# Patient Record
Sex: Male | Born: 1935 | Race: White | Hispanic: No | Marital: Married | Smoking: Former smoker
Health system: Southern US, Community
[De-identification: ages and names within clinical notes are randomized; demographics above are authoritative.]

## PROBLEM LIST (undated history)

## (undated) DIAGNOSIS — M1990 Unspecified osteoarthritis, unspecified site: Secondary | ICD-10-CM

## (undated) DIAGNOSIS — R600 Localized edema: Secondary | ICD-10-CM

## (undated) DIAGNOSIS — Z87440 Personal history of urinary (tract) infections: Secondary | ICD-10-CM

## (undated) DIAGNOSIS — R002 Palpitations: Secondary | ICD-10-CM

## (undated) DIAGNOSIS — I2510 Atherosclerotic heart disease of native coronary artery without angina pectoris: Secondary | ICD-10-CM

## (undated) DIAGNOSIS — E785 Hyperlipidemia, unspecified: Secondary | ICD-10-CM

## (undated) DIAGNOSIS — L409 Psoriasis, unspecified: Secondary | ICD-10-CM

## (undated) HISTORY — PX: TONSILLECTOMY: SUR1361

---
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
## 2001-12-15 ENCOUNTER — Ambulatory Visit (HOSPITAL_COMMUNITY): Admission: RE | Admit: 2001-12-15 | Discharge: 2001-12-15 | Payer: Self-pay | Admitting: *Deleted

## 2011-02-05 ENCOUNTER — Ambulatory Visit (HOSPITAL_COMMUNITY)
Admission: RE | Admit: 2011-02-05 | Discharge: 2011-02-05 | Disposition: A | Discharge status: Home / Self Care | Payer: Medicare Other | Source: Ambulatory Visit | Attending: Cardiology | Admitting: Cardiology

## 2011-02-05 DIAGNOSIS — R0989 Other specified symptoms and signs involving the circulatory and respiratory systems: Secondary | ICD-10-CM | POA: Insufficient documentation

## 2011-02-05 DIAGNOSIS — R0609 Other forms of dyspnea: Secondary | ICD-10-CM | POA: Insufficient documentation

## 2011-02-05 DIAGNOSIS — I2510 Atherosclerotic heart disease of native coronary artery without angina pectoris: Secondary | ICD-10-CM | POA: Insufficient documentation

## 2011-02-05 DIAGNOSIS — R0602 Shortness of breath: Secondary | ICD-10-CM | POA: Insufficient documentation

## 2011-02-05 HISTORY — PX: CARDIAC CATHETERIZATION: SHX172

## 2011-02-05 NOTE — Cardiovascular Report (Signed)
[name], [name] NO.:  [id number]  MEDICAL RECORD NO.:  [id number]  LOCATION:  [facility]                         FACILITY:  [facility]  PHYSICIAN:  Pamella Pert, MD DATE OF BIRTH:  1927/06/14  DATE OF PROCEDURE:  02/05/2011 DATE OF DISCHARGE:                           CARDIAC CATHETERIZATION   PROCEDURES PERFORMED: 1. Left ventriculography. 2. Selective right and left coronary arteriography.  INDICATIONS:  [name] is a very pleasant, fairly active 77-year-old gentleman with history of mild noncritical coronary artery disease by cardiac catheterization that was done in 2007.  He had been complaining of shortness of breath, dyspnea on exertion, and underwent a treadmill exercise stress test where he was markedly positive for ischemia at low level of stress test at 75 minute and EKG changes persisted for greater than 2 minutes into recovery.  Given his marked shortness of breath, markedly reduced exercise tolerance, and early positive EKG changes, progression of coronary disease was suspected. Hence, he was brought to the cardiac catheterization lab to evaluate his coronary anatomy.  HEMODYNAMIC DATA:  The left ventricular pressure was 102/9 with end- diastolic pressure of 14 mmHg.  Aortic pressure was 170/56 with a mean of 77 mmHg.  There was no pressure gradient across the aortic valve.  ANGIOGRAPHIC DATA:  Left ventricle.  Left ventricular systolic function was normal with ejection fraction of 55-60%.  There was no significant mitral regurgitation.  Right coronary artery.  Right coronary artery is a large-caliber vessel and a dominant vessel giving origin to two PDA branches and one large PL branch.  Mid segment of the right coronary artery shows a 20-30% stenosis.  Otherwise, it is smooth, large-caliber vessel, and normal and dominant.  Left main.  Left main coronary artery is a large-caliber vessel.  It is smooth and normal with mild  calcification.  LAD.  The proximal LAD shows mild eccentric calcification.  There is 20- 30% stenosis of the proximal LAD.  This is a very large-caliber vessel. Gives origin to small diagonal 1, moderate-sized diagonal 2.  Otherwise, the rest of the LAD is smooth and normal.  Circumflex artery.  Circumflex artery is a large-caliber vessel giving origin to a proximal obtuse marginal 1 and a large obtuse marginal 2 and 3.  It is smooth and normal.  IMPRESSION: 1. Noncritical coronary artery disease.  A 20-30% proximal left     anterior descending calcific stenosis and a mid right coronary     artery 20-30% stenosis.  Otherwise, smooth and normal vessels,     large-calibered vessels. 2. Normal left ventricular systolic function. 3. Normal left ventricular end-diastolic pressure.  RECOMMENDATIONS:  I suspect the EKG changes were probably falsely positive.  Continued weight loss and aggressive risk modification is indicated.  The patient will be discharged home today with outpatient followup.  TECHNIQUE OF THE PROCEDURE:  Under sterile precautions, using a 6-French right radial access, a 6-French TIG 4 catheter was advanced into the ascending aorta over a Glidewire.  The catheter was then utilized to engage left main coronary artery and also the right coronary artery and angiography was performed.  The catheter was then exchanged out of the body over  a safety J-wire which was Exchange Length.  A 5-French pigtail catheter was advanced back to the ascending aorta and then into the left ventricle.  Left ventriculography was performed both in the LAO and RAO projection.  Catheter was then pulled out of the body over a J-wire.  A total of 60 mL of contrast was utilized for diagnostic angiography. Hemostasis was obtained by applying TR band.  The patient tolerated the procedure well.  There were no immediate complications.     Pamella Pert, MD     JRG/MEDQ  D:  02/05/2011  T:   02/05/2011  Job:  [id number]  cc:   Massie Maroon, MD  Electronically Signed by Yates Decamp MD on 02/05/2011 11:29:46 AM

## 2013-09-10 NOTE — Progress Notes (Signed)
Surgery on 10/04/13.  Need orders in EPIC.  Thank You.  

## 2013-09-17 NOTE — Progress Notes (Signed)
Need orders please - pt coming for preop 4/30/154 - thank you

## 2013-09-20 ENCOUNTER — Encounter (HOSPITAL_COMMUNITY): Payer: Self-pay | Admitting: Pharmacy Technician

## 2013-09-23 ENCOUNTER — Ambulatory Visit (HOSPITAL_COMMUNITY)
Admission: RE | Admit: 2013-09-23 | Discharge: 2013-09-23 | Disposition: A | Discharge status: Home / Self Care | Payer: Medicare Other | Source: Ambulatory Visit | Attending: Anesthesiology | Admitting: Anesthesiology

## 2013-09-23 ENCOUNTER — Encounter (HOSPITAL_COMMUNITY)
Admission: RE | Admit: 2013-09-23 | Discharge: 2013-09-23 | Disposition: A | Discharge status: Home / Self Care | Payer: Medicare Other | Source: Ambulatory Visit | Attending: Orthopedic Surgery | Admitting: Orthopedic Surgery

## 2013-09-23 ENCOUNTER — Encounter (HOSPITAL_COMMUNITY): Payer: Self-pay

## 2013-09-23 DIAGNOSIS — Z01810 Encounter for preprocedural cardiovascular examination: Secondary | ICD-10-CM | POA: Insufficient documentation

## 2013-09-23 DIAGNOSIS — Z01812 Encounter for preprocedural laboratory examination: Secondary | ICD-10-CM | POA: Insufficient documentation

## 2013-09-23 DIAGNOSIS — Z01818 Encounter for other preprocedural examination: Secondary | ICD-10-CM | POA: Insufficient documentation

## 2013-09-23 HISTORY — DX: Unspecified osteoarthritis, unspecified site: M19.90

## 2013-09-23 HISTORY — DX: Psoriasis, unspecified: L40.9

## 2013-09-23 HISTORY — DX: Hyperlipidemia, unspecified: E78.5

## 2013-09-23 HISTORY — DX: Atherosclerotic heart disease of native coronary artery without angina pectoris: I25.10

## 2013-09-23 LAB — COMPREHENSIVE METABOLIC PANEL
ALBUMIN: 3.9 g/dL (ref 3.5–5.2)
ALT: 28 U/L (ref 0–53)
AST: 26 U/L (ref 0–37)
Alkaline Phosphatase: 69 U/L (ref 39–117)
BILIRUBIN TOTAL: 0.4 mg/dL (ref 0.3–1.2)
BUN: 20 mg/dL (ref 6–23)
CO2: 24 meq/L (ref 19–32)
CREATININE: 0.86 mg/dL (ref 0.50–1.35)
Calcium: 9.2 mg/dL (ref 8.4–10.5)
Chloride: 101 mEq/L (ref 96–112)
GFR calc Af Amer: >90 mL/min (ref >90)
GFR, EST NON AFRICAN AMERICAN: 78 mL/min — AB (ref >90)
Glucose, Bld: 87 mg/dL (ref 70–99)
Potassium: 5 mEq/L (ref 3.7–5.3)
Sodium: 138 mEq/L (ref 137–147)
Total Protein: 7.5 g/dL (ref 6.0–8.3)

## 2013-09-23 LAB — URINALYSIS, ROUTINE W REFLEX MICROSCOPIC
Bilirubin Urine: NEGATIVE
Glucose, UA: NEGATIVE mg/dL
Hgb urine dipstick: NEGATIVE
KETONES UR: NEGATIVE mg/dL
Leukocytes, UA: NEGATIVE
NITRITE: NEGATIVE
PROTEIN: NEGATIVE mg/dL
Specific Gravity, Urine: 1.024 (ref 1.005–1.030)
UROBILINOGEN UA: 1 mg/dL (ref 0.0–1.0)
pH: 6 (ref 5.0–8.0)

## 2013-09-23 LAB — CBC
HCT: 41.6 % (ref 39.0–52.0)
Hemoglobin: 14.2 g/dL (ref 13.0–17.0)
MCH: 31.2 pg (ref 26.0–34.0)
MCHC: 34.1 g/dL (ref 30.0–36.0)
MCV: 91.4 fL (ref 78.0–100.0)
PLATELETS: 185 10*3/uL (ref 150–400)
RBC: 4.55 MIL/uL (ref 4.22–5.81)
RDW: 13.9 % (ref 11.5–15.5)
WBC: 9.2 10*3/uL (ref 4.0–10.5)

## 2013-09-23 LAB — PROTIME-INR
INR: 1.05 (ref 0.00–1.49)
PROTHROMBIN TIME: 13.5 s (ref 11.6–15.2)

## 2013-09-23 LAB — APTT: APTT: 32 s (ref 24–37)

## 2013-09-23 LAB — SURGICAL PCR SCREEN
MRSA, PCR: NEGATIVE
Staphylococcus aureus: NEGATIVE

## 2013-09-23 NOTE — Progress Notes (Signed)
09/23/13 1400  OBSTRUCTIVE SLEEP APNEA  Have you ever been diagnosed with sleep apnea through a sleep study? No  Do you snore loudly (loud enough to be heard through closed doors)?  0  Do you often feel tired, fatigued, or sleepy during the daytime? 0  Has anyone observed you stop breathing during your sleep? 1  Do you have, or are you being treated for high blood pressure? 0  BMI more than 35 kg/m2? 0  Age over 78 years old? 1  Neck circumference greater than 40 cm/16 inches? 1  Gender: 1  Obstructive Sleep Apnea Score 4  Score 4 or greater  Results sent to PCP

## 2013-09-23 NOTE — Patient Instructions (Signed)
[facility]  ON:  Monday MAY 11  REPORT TO  SHORT STAY CENTER AT:  10:45 AM      PHONE # FOR SHORT STAY IS [phone]  DO NOT EAT  ANYTHING AFTER MIDNIGHT THE NIGHT BEFORE YOUR SURGERY.  YOU MAY BRUSH YOUR TEETH  - NO FOOD, NO CHEWING GUM, NO MINTS, NO CANDIES, NO CHEWING TOBACCO. YOU MAY HAVE CLEAR LIQUIDS TO DRINK FROM MIDNIGHT UNTIL 7:45 AM DAY OF YOUR SURGERY - LIKE WATER, COFFEE ( NO MILK OR MILK PRODUCTS ).\, CRANBERRY JUICE.  NOTHING TO DRINK AFTER 7:45 AM DAY OF SURGERY.  PLEASE TAKE THE FOLLOWING MEDICATIONS THE AM OF YOUR SURGERY WITH A FEW SIPS OF WATER:  NO MEDS TO TAKE  IF YOU USE INHALERS--USE YOUR INHALERS THE AM OF YOUR SURGERY AND BRING INHALERS TO THE HOSPITAL.   IF YOU ARE DIABETIC:  DO NOT TAKE ANY DIABETIC MEDICATIONS THE AM OF YOUR SURGERY.  IF YOU TAKE INSULIN IN THE EVENINGS--PLEASE ONLY TAKE 1/2 NORMAL EVENING DOSE THE NIGHT BEFORE YOUR SURGERY.  NO INSULIN THE AM OF YOUR SURGERY. IF YOU HAVE SLEEP APNEA AND USE CPAP OR BIPAP--PLEASE BRING THE MASK AND THE TUBING.  DO NOT BRING YOUR MACHINE.  DO NOT BRING VALUABLES, MONEY, CREDIT CARDS.  DO NOT WEAR JEWELRY, MAKE-UP, NAIL POLISH AND NO METAL PINS OR CLIPS IN YOUR HAIR. CONTACT LENS, DENTURES / PARTIALS, GLASSES SHOULD NOT BE WORN TO SURGERY AND IN MOST CASES-HEARING AIDS WILL NEED TO BE REMOVED.  BRING YOUR GLASSES CASE, ANY EQUIPMENT NEEDED FOR YOUR CONTACT LENS. FOR PATIENTS ADMITTED TO THE HOSPITAL--CHECK OUT TIME THE DAY OF DISCHARGE IS 11:00 AM.  ALL INPATIENT ROOMS ARE PRIVATE - WITH BATHROOM, TELEPHONE, TELEVISION AND WIFI INTERNET.                                                    PLEASE READ OVER ANY  FACT SHEETS THAT YOU WERE GIVEN: MRSA INFORMATION, BLOOD TRANSFUSION INFORMATION, INCENTIVE SPIROMETER INFORMATION.  PLEASE BE AWARE THAT YOU MAY NEED ADDITIONAL BLOOD DRAWN DAY OF YOUR  SURGERY  _______________________________________________________________________   [facility] - Preparing for [facility] Before surgery, you can play an important role.  Because skin is not sterile, your skin needs to be as free of germs as possible.  You can reduce the number of germs on your skin by washing with CHG (chlorahexidine gluconate) soap before surgery.  CHG is an antiseptic cleaner which kills germs and bonds with the skin to continue killing germs even after washing. Please DO NOT use if you have an allergy to CHG or antibacterial soaps.  If your skin becomes reddened/irritated stop using the CHG and inform your nurse when you arrive at Short Stay. Do not shave (including legs and underarms) for at least 48 hours prior to the first CHG shower.  You may shave your face. Please follow these instructions carefully:  1.  Shower with CHG Soap the night before surgery and the  morning of Surgery.  2.  If you choose to wash your hair, wash your hair first as usual with your  normal  shampoo.  3.  After you shampoo, rinse your hair and body thoroughly to remove the  shampoo.  4.  Use CHG as you would any other liquid soap.  You can apply chg directly  to the skin and wash                       Gently with a scrungie or clean washcloth.  5.  Apply the CHG Soap to your body ONLY FROM THE NECK DOWN.   Do not use on open                           Wound or open sores. Avoid contact with eyes, ears mouth and genitals (private parts).                        Genitals (private parts) with your normal soap.             6.  Wash thoroughly, paying special attention to the area where your surgery  will be performed.  7.  Thoroughly rinse your body with warm water from the neck down.  8.  DO NOT shower/wash with your normal soap after using and rinsing off  the CHG Soap.                9.  Pat yourself dry with a clean towel.            10.  Wear clean pajamas.            11.   Place clean sheets on your bed the night of your first shower and do not  sleep with pets. Day of Surgery : Do not apply any lotions/deodorants the morning of surgery.  Please wear clean clothes to the hospital/surgery center.  FAILURE TO FOLLOW THESE INSTRUCTIONS MAY RESULT IN THE CANCELLATION OF YOUR SURGERY PATIENT SIGNATURE_________________________________  NURSE SIGNATURE__________________________________  ________________________________________________________________________   [name]  An incentive spirometer is a tool that can help keep your lungs clear and active. This tool measures how well you are filling your lungs with each breath. Taking long deep breaths may help reverse or decrease the chance of developing breathing (pulmonary) problems (especially infection) following:  A long period of time when you are unable to move or be active. BEFORE THE PROCEDURE   If the spirometer includes an indicator to show your best effort, your nurse or respiratory therapist will set it to a desired goal.  If possible, sit up straight or lean slightly forward. Try not to slouch.  Hold the incentive spirometer in an upright position. INSTRUCTIONS FOR USE  1. Sit on the edge of your bed if possible, or sit up as far as you can in bed or on a chair. 2. Hold the incentive spirometer in an upright position. 3. Breathe out normally. 4. Place the mouthpiece in your mouth and seal your lips tightly around it. 5. Breathe in slowly and as deeply as possible, raising the piston or the ball toward the top of the column. 6. Hold your breath for 3-5 seconds or for as long as possible. Allow the piston or ball to fall to the bottom of the column. 7. Remove the mouthpiece from your mouth and breathe out normally. 8. Rest for a few seconds and repeat Steps 1 through 7 at least 10 times every 1-2 hours when you are awake. Take your time and take a few normal breaths between deep  breaths. 9. The spirometer may include an indicator to show your best  effort. Use the indicator as a goal to work toward during each repetition. 10. After each set of 10 deep breaths, practice coughing to be sure your lungs are clear. If you have an incision (the cut made at the time of surgery), support your incision when coughing by placing a pillow or rolled up towels firmly against it. Once you are able to get out of bed, walk around indoors and cough well. You may stop using the incentive spirometer when instructed by your caregiver.  RISKS AND COMPLICATIONS  Take your time so you do not get dizzy or light-headed.  If you are in pain, you may need to take or ask for pain medication before doing incentive spirometry. It is harder to take a deep breath if you are having pain. AFTER USE  Rest and breathe slowly and easily.  It can be helpful to keep track of a log of your progress. Your caregiver can provide you with a simple table to help with this. If you are using the spirometer at home, follow these instructions: SEEK MEDICAL CARE IF:   You are having difficultly using the spirometer.  You have trouble using the spirometer as often as instructed.  Your pain medication is not giving enough relief while using the spirometer.  You develop fever of 100.5 F (38.1 C) or higher. SEEK IMMEDIATE MEDICAL CARE IF:   You cough up bloody sputum that had not been present before.  You develop fever of 102 F (38.9 C) or greater.  You develop worsening pain at or near the incision site. MAKE SURE YOU:   Understand these instructions.  Will watch your condition.  Will get help right away if you are not doing well or get worse. Document Released: 09/23/2006 Document Revised: 08/05/2011 Document Reviewed: 11/24/2006 Exit[employer] Patient Information 2014 [employer], [place].   ________________________________________________________________________  WHAT IS A BLOOD TRANSFUSION? Blood  Transfusion Information  A transfusion is the replacement of blood or some of its parts. Blood is made up of multiple cells which provide different functions.  Red blood cells carry oxygen and are used for blood loss replacement.  White blood cells fight against infection.  Platelets control bleeding.  Plasma helps clot blood.  Other blood products are available for specialized needs, such as hemophilia or other clotting disorders. BEFORE THE TRANSFUSION  Who gives blood for transfusions?   Healthy volunteers who are fully evaluated to make sure their blood is safe. This is blood bank blood. Transfusion therapy is the safest it has ever been in the practice of medicine. Before blood is taken from a donor, a complete history is taken to make sure that person has no history of diseases nor engages in risky social behavior (examples are intravenous drug use or sexual activity with multiple partners). The donor's travel history is screened to minimize risk of transmitting infections, such as malaria. The donated blood is tested for signs of infectious diseases, such as HIV and hepatitis. The blood is then tested to be sure it is compatible with you in order to minimize the chance of a transfusion reaction. If you or a relative donates blood, this is often done in anticipation of surgery and is not appropriate for emergency situations. It takes many days to process the donated blood. RISKS AND COMPLICATIONS Although transfusion therapy is very safe and saves many lives, the main dangers of transfusion include:   Getting an infectious disease.  Developing a transfusion reaction. This is an allergic reaction to something in the  blood you were given. Every precaution is taken to prevent this. The decision to have a blood transfusion has been considered carefully by your caregiver before blood is given. Blood is not given unless the benefits outweigh the risks. AFTER THE TRANSFUSION  Right after  receiving a blood transfusion, you will usually feel much better and more energetic. This is especially true if your red blood cells have gotten low (anemic). The transfusion raises the level of the red blood cells which carry oxygen, and this usually causes an energy increase.  The nurse administering the transfusion will monitor you carefully for complications. HOME CARE INSTRUCTIONS  No special instructions are needed after a transfusion. You may find your energy is better. Speak with your caregiver about any limitations on activity for underlying diseases you may have. SEEK MEDICAL CARE IF:   Your condition is not improving after your transfusion.  You develop redness or irritation at the intravenous (IV) site. SEEK IMMEDIATE MEDICAL CARE IF:  Any of the following symptoms occur over the next 12 hours:  Shaking chills.  You have a temperature by mouth above 102 F (38.9 C), not controlled by medicine.  Chest, back, or muscle pain.  People around you feel you are not acting correctly or are confused.  Shortness of breath or difficulty breathing.  Dizziness and fainting.  You get a rash or develop hives.  You have a decrease in urine output.  Your urine turns a dark color or changes to pink, red, or brown. Any of the following symptoms occur over the next 10 days:  You have a temperature by mouth above 102 F (38.9 C), not controlled by medicine.  Shortness of breath.  Weakness after normal activity.  The white part of the eye turns yellow (jaundice).  You have a decrease in the amount of urine or are urinating less often.  Your urine turns a dark color or changes to pink, red, or brown. Document Released: 05/10/2000 Document Revised: 08/05/2011 Document Reviewed: 12/28/2007 [facility] Patient Information 2014 [place], [place].  _______________________________________________________________________

## 2013-09-23 NOTE — Pre-Procedure Instructions (Signed)
[facility] D[facility] CARDIOLOGY OFFICE NOTE 02/27/12 FROM DR. [employer] AND CARDIAC CATH REPORT 02/05/11 [facility] ON PT'S CHART.

## 2013-09-25 ENCOUNTER — Other Ambulatory Visit: Payer: Self-pay | Admitting: Orthopedic Surgery

## 2013-09-30 NOTE — H&P (Signed)
TOTAL KNEE ADMISSION H&P  Patient is being admitted for left total knee arthroplasty.  Subjective:  Chief Complaint:left knee pain.  HPI: [name], 80 y.o. male, has a history of pain and functional disability in the left knee due to arthritis and has failed non-surgical conservative treatments for greater than 12 weeks to includeNSAID's and/or analgesics, corticosteriod injections and activity modification.  Onset of symptoms was gradual, starting 5 years ago with gradually worsening course since that time. The patient noted no past surgery on the left knee(s).  Patient currently rates pain in the left knee(s) at 6 out of 10 with activity. Patient has night pain, worsening of pain with activity and weight bearing, pain that interferes with activities of daily living, pain with passive range of motion, crepitus and joint swelling.  Patient has evidence of periarticular osteophytes and joint space narrowing by imaging studies.  There is no active infection.   Past Medical History  Diagnosis Date  . Hypertension   . Shortness of breath     WITH EXERTION  . Arthritis     STATES HIS SHOULDERS ARE "FROZEN" - VERY LIMITIED RANGE OF MOTION; OA BOTH KNEES  . Hyperlipidemia   . Psoriasis   . Coronary artery disease     "MILD NONCRITICAL CORONARY ARTERY DISEASE " - PER OFFICE NOTE DR. Jacinto HalimGANJI 02/27/12 - PT NO LONGER HAS TO SEE CARDIOLGIST UNLESS HE HAS A NEED.    Past Surgical History  Procedure Laterality Date  . Cardiac catheterization  02-05-11  . Tonsillectomy      AGE 65    No prescriptions prior to admission   No Known Allergies  History  Substance Use Topics  . Smoking status: Former Smoker -- 20 years    Types: Cigarettes, Pipe, Cigars  . Smokeless tobacco: Never Used  . Alcohol Use: Yes     Comment: QUIT SMOKING ABOUT 25 YRS AGO  RARE ALCOHOL      Review of Systems  Constitutional: Negative.   HENT: Positive for hearing loss and tinnitus. Negative for congestion, ear  discharge, ear pain, nosebleeds and sore throat.   Eyes: Negative.   Respiratory: Negative.  Negative for stridor.   Cardiovascular: Negative.   Gastrointestinal: Negative.   Genitourinary: Positive for frequency. Negative for dysuria, urgency, hematuria and flank pain.  Musculoskeletal: Positive for joint pain. Negative for back pain, falls, myalgias and neck pain.       Left knee pain  Skin: Positive for rash. Negative for itching.       Psoriasis   Neurological: Negative.   Psychiatric/Behavioral: Negative.     Objective:  Physical Exam  Constitutional: He is oriented to person, place, and time. He appears well-developed and well-nourished. No distress.  HENT:  Head: Normocephalic and atraumatic.  Right Ear: External ear normal.  Left Ear: External ear normal.  Nose: Nose normal.  Mouth/Throat: Oropharynx is clear and moist.  Eyes: Conjunctivae and EOM are normal.  Neck: Normal range of motion. Neck supple.  Cardiovascular: Normal rate, regular rhythm, normal heart sounds and intact distal pulses.   No murmur heard. Respiratory: Effort normal and breath sounds normal. No respiratory distress. He has no wheezes.  GI: Soft. Bowel sounds are normal. He exhibits no distension. There is no tenderness.  Musculoskeletal:       Right hip: Normal.       Left hip: Normal.       Right knee: He exhibits decreased range of motion and swelling. He exhibits no effusion  and no erythema. Tenderness found. Medial joint line tenderness noted. No lateral joint line tenderness noted.       Left knee: He exhibits decreased range of motion and swelling. He exhibits no effusion and no erythema. Tenderness found. Medial joint line and lateral joint line tenderness noted.       Right lower leg: He exhibits no tenderness and no swelling.       Left lower leg: He exhibits no tenderness and no swelling.  Evaluation of his left knee shows no effusion. His range of motion is about 5-125 degrees. There is  moderate crepitus on range of motion. He is tender medial greater than lateral with no instability noted.  Neurological: He is alert and oriented to person, place, and time. He has normal strength and normal reflexes. No sensory deficit.  Skin: He is not diaphoretic. No erythema.  Psychiatric: He has a normal mood and affect. His behavior is normal.   Vitals Weight: 245 lb Height: 71 in Body Surface Area: 2.36 m Body Mass Index: 34.17 kg/m Pulse: 76 (Regular) BP: 130/78 (Sitting, Left Arm, Standard)   Imaging Review Plain radiographs demonstrate severe degenerative joint disease of the left knee(s). The overall alignment ismild varus. The bone quality appears to be good for age and reported activity level.  Assessment/Plan:  End stage arthritis, left knee   The patient history, physical examination, clinical judgment of the provider and imaging studies are consistent with end stage degenerative joint disease of the left knee(s) and total knee arthroplasty is deemed medically necessary. The treatment options including medical management, injection therapy arthroscopy and arthroplasty were discussed at length. The risks and benefits of total knee arthroplasty were presented and reviewed. The risks due to aseptic loosening, infection, stiffness, patella tracking problems, thromboembolic complications and other imponderables were discussed. The patient acknowledged the explanation, agreed to proceed with the plan and consent was signed. Patient is being admitted for inpatient treatment for surgery, pain control, PT, OT, prophylactic antibiotics, VTE prophylaxis, progressive ambulation and ADL's and discharge planning. The patient is planning to be discharged to skilled nursing facility  No TXA due to CAD    Dimitri PedAmber Abree Romick, PA-C

## 2013-10-04 ENCOUNTER — Inpatient Hospital Stay (HOSPITAL_COMMUNITY): Payer: Medicare Other | Admitting: Certified Registered Nurse Anesthetist

## 2013-10-04 ENCOUNTER — Encounter (HOSPITAL_COMMUNITY)
Admission: RE | Disposition: A | Discharge status: Skilled Nursing Facility | Payer: Self-pay | Source: Ambulatory Visit | Attending: Orthopedic Surgery

## 2013-10-04 ENCOUNTER — Encounter (HOSPITAL_COMMUNITY): Payer: Medicare Other | Admitting: Certified Registered Nurse Anesthetist

## 2013-10-04 ENCOUNTER — Inpatient Hospital Stay (HOSPITAL_COMMUNITY)
Admission: RE | Admit: 2013-10-04 | Discharge: 2013-10-07 | DRG: 470 | Disposition: A | Discharge status: Skilled Nursing Facility | Payer: Medicare Other | Source: Ambulatory Visit | Attending: Orthopedic Surgery | Admitting: Orthopedic Surgery

## 2013-10-04 ENCOUNTER — Encounter (HOSPITAL_COMMUNITY): Payer: Self-pay | Admitting: *Deleted

## 2013-10-04 DIAGNOSIS — M179 Osteoarthritis of knee, unspecified: Secondary | ICD-10-CM | POA: Diagnosis present

## 2013-10-04 DIAGNOSIS — D62 Acute posthemorrhagic anemia: Secondary | ICD-10-CM | POA: Diagnosis not present

## 2013-10-04 DIAGNOSIS — Z87891 Personal history of nicotine dependence: Secondary | ICD-10-CM

## 2013-10-04 DIAGNOSIS — E785 Hyperlipidemia, unspecified: Secondary | ICD-10-CM | POA: Diagnosis present

## 2013-10-04 DIAGNOSIS — I10 Essential (primary) hypertension: Secondary | ICD-10-CM | POA: Diagnosis present

## 2013-10-04 DIAGNOSIS — M1710 Unilateral primary osteoarthritis, unspecified knee: Secondary | ICD-10-CM | POA: Diagnosis present

## 2013-10-04 DIAGNOSIS — E871 Hypo-osmolality and hyponatremia: Secondary | ICD-10-CM | POA: Diagnosis not present

## 2013-10-04 DIAGNOSIS — L408 Other psoriasis: Secondary | ICD-10-CM | POA: Diagnosis present

## 2013-10-04 DIAGNOSIS — Z96652 Presence of left artificial knee joint: Secondary | ICD-10-CM

## 2013-10-04 DIAGNOSIS — I2510 Atherosclerotic heart disease of native coronary artery without angina pectoris: Secondary | ICD-10-CM | POA: Diagnosis present

## 2013-10-04 DIAGNOSIS — M898X9 Other specified disorders of bone, unspecified site: Secondary | ICD-10-CM | POA: Diagnosis present

## 2013-10-04 HISTORY — PX: TOTAL KNEE ARTHROPLASTY: SHX125

## 2013-10-04 LAB — ABO/RH: ABO/RH(D): O POS

## 2013-10-04 LAB — TYPE AND SCREEN
ABO/RH(D): O POS
Antibody Screen: NEGATIVE

## 2013-10-04 SURGERY — ARTHROPLASTY, KNEE, TOTAL
Anesthesia: Spinal | Site: Knee | Laterality: Left

## 2013-10-04 MED ORDER — DEXTROSE 5 % IV SOLN
500.0000 mg | Freq: Four times a day (QID) | INTRAVENOUS | Status: DC | PRN
  Administered 2013-10-04: 500 mg via INTRAVENOUS
  Filled 2013-10-04: qty 5

## 2013-10-04 MED ORDER — PROMETHAZINE HCL 25 MG/ML IJ SOLN: 6.2500 mg | INTRAMUSCULAR | Status: DC | PRN

## 2013-10-04 MED ORDER — PHENOL 1.4 % MT LIQD: 1.0000 | OROMUCOSAL | Status: DC | PRN

## 2013-10-04 MED ORDER — SODIUM CHLORIDE 0.9 % IJ SOLN
INTRAMUSCULAR | Status: AC
  Filled 2013-10-04: qty 50

## 2013-10-04 MED ORDER — STERILE WATER FOR IRRIGATION IR SOLN
Status: DC | PRN
  Administered 2013-10-04: 1500 mL

## 2013-10-04 MED ORDER — DEXAMETHASONE SODIUM PHOSPHATE 10 MG/ML IJ SOLN
INTRAMUSCULAR | Status: DC | PRN
  Administered 2013-10-04: 10 mg via INTRAVENOUS

## 2013-10-04 MED ORDER — ONDANSETRON HCL 4 MG/2ML IJ SOLN: 4.0000 mg | Freq: Four times a day (QID) | INTRAMUSCULAR | Status: DC | PRN

## 2013-10-04 MED ORDER — BUPIVACAINE HCL (PF) 0.25 % IJ SOLN
INTRAMUSCULAR | Status: AC
  Filled 2013-10-04: qty 30

## 2013-10-04 MED ORDER — SODIUM CHLORIDE 0.9 % IJ SOLN
INTRAMUSCULAR | Status: DC | PRN
  Administered 2013-10-04: 30 mL

## 2013-10-04 MED ORDER — CEFAZOLIN SODIUM-DEXTROSE 2-3 GM-% IV SOLR
2.0000 g | INTRAVENOUS | Status: AC
  Administered 2013-10-04: 2 g via INTRAVENOUS

## 2013-10-04 MED ORDER — ACETAMINOPHEN 325 MG PO TABS
650.0000 mg | ORAL_TABLET | Freq: Four times a day (QID) | ORAL | Status: DC | PRN
  Administered 2013-10-05 – 2013-10-06 (×2): 650 mg via ORAL
  Filled 2013-10-04 (×2): qty 2

## 2013-10-04 MED ORDER — DEXAMETHASONE SODIUM PHOSPHATE 10 MG/ML IJ SOLN
INTRAMUSCULAR | Status: AC
  Filled 2013-10-04: qty 1

## 2013-10-04 MED ORDER — BUPIVACAINE HCL (PF) 0.75 % IJ SOLN
INTRAMUSCULAR | Status: DC | PRN
  Administered 2013-10-04: 2 mL

## 2013-10-04 MED ORDER — CEFAZOLIN SODIUM-DEXTROSE 2-3 GM-% IV SOLR
2.0000 g | Freq: Four times a day (QID) | INTRAVENOUS | Status: AC
  Administered 2013-10-04 – 2013-10-05 (×2): 2 g via INTRAVENOUS
  Filled 2013-10-04 (×2): qty 50

## 2013-10-04 MED ORDER — BISACODYL 10 MG RE SUPP: 10.0000 mg | Freq: Every day | RECTAL | Status: DC | PRN

## 2013-10-04 MED ORDER — FENTANYL CITRATE 0.05 MG/ML IJ SOLN
INTRAMUSCULAR | Status: AC
  Filled 2013-10-04: qty 2

## 2013-10-04 MED ORDER — MIDAZOLAM HCL 5 MG/5ML IJ SOLN
INTRAMUSCULAR | Status: DC | PRN
  Administered 2013-10-04 (×2): 1 mg via INTRAVENOUS

## 2013-10-04 MED ORDER — ACETAMINOPHEN 10 MG/ML IV SOLN
1000.0000 mg | Freq: Once | INTRAVENOUS | Status: DC
  Filled 2013-10-04: qty 100

## 2013-10-04 MED ORDER — OXYCODONE HCL 5 MG PO TABS
5.0000 mg | ORAL_TABLET | ORAL | Status: DC | PRN
  Administered 2013-10-04 – 2013-10-07 (×4): 5 mg via ORAL
  Filled 2013-10-04: qty 2
  Filled 2013-10-04 (×4): qty 1

## 2013-10-04 MED ORDER — 0.9 % SODIUM CHLORIDE (POUR BTL) OPTIME
TOPICAL | Status: DC | PRN
  Administered 2013-10-04: 1000 mL

## 2013-10-04 MED ORDER — ONDANSETRON HCL 4 MG/2ML IJ SOLN
INTRAMUSCULAR | Status: DC | PRN
  Administered 2013-10-04 (×2): 2 mg via INTRAVENOUS

## 2013-10-04 MED ORDER — LACTATED RINGERS IV SOLN
INTRAVENOUS | Status: DC | PRN
  Administered 2013-10-04 (×2): via INTRAVENOUS

## 2013-10-04 MED ORDER — SODIUM CHLORIDE 0.9 % IR SOLN
Status: DC | PRN
  Administered 2013-10-04: 1000 mL

## 2013-10-04 MED ORDER — METOCLOPRAMIDE HCL 5 MG PO TABS
5.0000 mg | ORAL_TABLET | Freq: Three times a day (TID) | ORAL | Status: DC | PRN
Start: 2013-10-04 — End: 2013-10-07
  Filled 2013-10-04: qty 2

## 2013-10-04 MED ORDER — CHLORHEXIDINE GLUCONATE 4 % EX LIQD: 60.0000 mL | Freq: Once | CUTANEOUS | Status: DC

## 2013-10-04 MED ORDER — ACETAMINOPHEN 500 MG PO TABS
1000.0000 mg | ORAL_TABLET | Freq: Four times a day (QID) | ORAL | Status: AC
  Administered 2013-10-04 – 2013-10-05 (×3): 1000 mg via ORAL
  Filled 2013-10-04 (×4): qty 2

## 2013-10-04 MED ORDER — TRAMADOL HCL 50 MG PO TABS
50.0000 mg | ORAL_TABLET | Freq: Four times a day (QID) | ORAL | Status: DC | PRN
  Administered 2013-10-05 – 2013-10-06 (×3): 100 mg via ORAL
  Filled 2013-10-04 (×3): qty 2

## 2013-10-04 MED ORDER — SODIUM CHLORIDE 0.9 % IV SOLN: INTRAVENOUS | Status: DC

## 2013-10-04 MED ORDER — ACETAMINOPHEN 650 MG RE SUPP: 650.0000 mg | Freq: Four times a day (QID) | RECTAL | Status: DC | PRN

## 2013-10-04 MED ORDER — KETOROLAC TROMETHAMINE 15 MG/ML IJ SOLN
7.5000 mg | Freq: Four times a day (QID) | INTRAMUSCULAR | Status: AC | PRN
  Administered 2013-10-04: 7.5 mg via INTRAVENOUS

## 2013-10-04 MED ORDER — PROPOFOL 10 MG/ML IV BOLUS
INTRAVENOUS | Status: DC | PRN
  Administered 2013-10-04: 20 mg via INTRAVENOUS

## 2013-10-04 MED ORDER — PROPOFOL INFUSION 10 MG/ML OPTIME
INTRAVENOUS | Status: DC | PRN
  Administered 2013-10-04: 100 ug/kg/min via INTRAVENOUS

## 2013-10-04 MED ORDER — ONDANSETRON HCL 4 MG PO TABS: 4.0000 mg | ORAL_TABLET | Freq: Four times a day (QID) | ORAL | Status: DC | PRN

## 2013-10-04 MED ORDER — DEXAMETHASONE 4 MG PO TABS
10.0000 mg | ORAL_TABLET | Freq: Every day | ORAL | Status: AC
  Administered 2013-10-05: 10 mg via ORAL
  Filled 2013-10-04: qty 1

## 2013-10-04 MED ORDER — HYDROMORPHONE HCL PF 1 MG/ML IJ SOLN: 0.2500 mg | INTRAMUSCULAR | Status: DC | PRN

## 2013-10-04 MED ORDER — BUPIVACAINE LIPOSOME 1.3 % IJ SUSP
20.0000 mL | Freq: Once | INTRAMUSCULAR | Status: DC
  Filled 2013-10-04: qty 20

## 2013-10-04 MED ORDER — DEXAMETHASONE SODIUM PHOSPHATE 10 MG/ML IJ SOLN
10.0000 mg | Freq: Every day | INTRAMUSCULAR | Status: AC
  Filled 2013-10-04: qty 1

## 2013-10-04 MED ORDER — DIPHENHYDRAMINE HCL 12.5 MG/5ML PO ELIX: 12.5000 mg | ORAL_SOLUTION | ORAL | Status: DC | PRN

## 2013-10-04 MED ORDER — MIDAZOLAM HCL 2 MG/2ML IJ SOLN
INTRAMUSCULAR | Status: AC
  Filled 2013-10-04: qty 2

## 2013-10-04 MED ORDER — DOCUSATE SODIUM 100 MG PO CAPS
100.0000 mg | ORAL_CAPSULE | Freq: Two times a day (BID) | ORAL | Status: DC
  Administered 2013-10-04 – 2013-10-07 (×5): 100 mg via ORAL

## 2013-10-04 MED ORDER — PHENYLEPHRINE HCL 10 MG/ML IJ SOLN
INTRAMUSCULAR | Status: DC | PRN
  Administered 2013-10-04: 40 ug via INTRAVENOUS
  Administered 2013-10-04: 20 ug via INTRAVENOUS
  Administered 2013-10-04: 40 ug via INTRAVENOUS
  Administered 2013-10-04: 20 ug via INTRAVENOUS

## 2013-10-04 MED ORDER — OXYCODONE HCL 5 MG/5ML PO SOLN
5.0000 mg | Freq: Once | ORAL | Status: DC | PRN
  Filled 2013-10-04: qty 5

## 2013-10-04 MED ORDER — BUPIVACAINE HCL 0.25 % IJ SOLN
INTRAMUSCULAR | Status: DC | PRN
  Administered 2013-10-04: 20 mL

## 2013-10-04 MED ORDER — METHOCARBAMOL 500 MG PO TABS
500.0000 mg | ORAL_TABLET | Freq: Four times a day (QID) | ORAL | Status: DC | PRN
  Administered 2013-10-04 – 2013-10-06 (×5): 500 mg via ORAL
  Filled 2013-10-04 (×5): qty 1

## 2013-10-04 MED ORDER — FENTANYL CITRATE 0.05 MG/ML IJ SOLN
INTRAMUSCULAR | Status: DC | PRN
  Administered 2013-10-04 (×2): 50 ug via INTRAVENOUS

## 2013-10-04 MED ORDER — BUPIVACAINE LIPOSOME 1.3 % IJ SUSP
INTRAMUSCULAR | Status: DC | PRN
  Administered 2013-10-04: 20 mL

## 2013-10-04 MED ORDER — MEPERIDINE HCL 50 MG/ML IJ SOLN: 6.2500 mg | INTRAMUSCULAR | Status: DC | PRN

## 2013-10-04 MED ORDER — OXYCODONE HCL 5 MG PO TABS: 5.0000 mg | ORAL_TABLET | Freq: Once | ORAL | Status: DC | PRN

## 2013-10-04 MED ORDER — KETOROLAC TROMETHAMINE 15 MG/ML IJ SOLN
INTRAMUSCULAR | Status: AC
  Filled 2013-10-04: qty 1

## 2013-10-04 MED ORDER — DEXTROSE-NACL 5-0.9 % IV SOLN
INTRAVENOUS | Status: DC
  Administered 2013-10-04: 100 mL/h via INTRAVENOUS
  Administered 2013-10-05: 06:00:00 via INTRAVENOUS

## 2013-10-04 MED ORDER — ONDANSETRON HCL 4 MG/2ML IJ SOLN
INTRAMUSCULAR | Status: AC
  Filled 2013-10-04: qty 2

## 2013-10-04 MED ORDER — ACETAMINOPHEN 10 MG/ML IV SOLN
INTRAVENOUS | Status: DC | PRN
  Administered 2013-10-04: 1000 mg via INTRAVENOUS

## 2013-10-04 MED ORDER — RIVAROXABAN 10 MG PO TABS
10.0000 mg | ORAL_TABLET | Freq: Every day | ORAL | Status: DC
Start: 2013-10-05 — End: 2013-10-07
  Administered 2013-10-05 – 2013-10-07 (×3): 10 mg via ORAL
  Filled 2013-10-04 (×4): qty 1

## 2013-10-04 MED ORDER — POLYETHYLENE GLYCOL 3350 17 G PO PACK
17.0000 g | PACK | Freq: Every day | ORAL | Status: DC | PRN
  Administered 2013-10-06: 17 g via ORAL

## 2013-10-04 MED ORDER — MENTHOL 3 MG MT LOZG: 1.0000 | LOZENGE | OROMUCOSAL | Status: DC | PRN

## 2013-10-04 MED ORDER — CEFAZOLIN SODIUM-DEXTROSE 2-3 GM-% IV SOLR
INTRAVENOUS | Status: AC
  Filled 2013-10-04: qty 50

## 2013-10-04 MED ORDER — FLEET ENEMA 7-19 GM/118ML RE ENEM: 1.0000 | ENEMA | Freq: Once | RECTAL | Status: AC | PRN

## 2013-10-04 MED ORDER — MORPHINE SULFATE 2 MG/ML IJ SOLN
1.0000 mg | INTRAMUSCULAR | Status: DC | PRN
Start: 2013-10-04 — End: 2013-10-07

## 2013-10-04 MED ORDER — TRIAMCINOLONE ACETONIDE 0.1 % EX CREA
1.0000 "application " | TOPICAL_CREAM | Freq: Two times a day (BID) | CUTANEOUS | Status: DC
  Administered 2013-10-04: 1 via TOPICAL
  Filled 2013-10-04: qty 15

## 2013-10-04 MED ORDER — METOCLOPRAMIDE HCL 5 MG/ML IJ SOLN: 5.0000 mg | Freq: Three times a day (TID) | INTRAMUSCULAR | Status: DC | PRN

## 2013-10-04 MED ORDER — DEXAMETHASONE SODIUM PHOSPHATE 10 MG/ML IJ SOLN: 10.0000 mg | Freq: Once | INTRAMUSCULAR | Status: DC

## 2013-10-04 MED ORDER — PROPOFOL 10 MG/ML IV BOLUS
INTRAVENOUS | Status: AC
  Filled 2013-10-04: qty 20

## 2013-10-04 SURGICAL SUPPLY — 57 items
BAG ZIPLOCK 12X15 (MISCELLANEOUS) ×3 IMPLANT
BANDAGE ELASTIC 6 VELCRO ST LF (GAUZE/BANDAGES/DRESSINGS) ×3 IMPLANT
BANDAGE ESMARK 6X9 LF (GAUZE/BANDAGES/DRESSINGS) ×1 IMPLANT
BLADE SAG 18X100X1.27 (BLADE) ×3 IMPLANT
BLADE SAW SGTL 11.0X1.19X90.0M (BLADE) ×3 IMPLANT
BNDG ESMARK 6X9 LF (GAUZE/BANDAGES/DRESSINGS) ×3
BOWL SMART MIX CTS (DISPOSABLE) ×3 IMPLANT
CAPT RP KNEE ×3 IMPLANT
CEMENT HV SMART SET (Cement) ×6 IMPLANT
CLOSURE WOUND 1/2 X4 (GAUZE/BANDAGES/DRESSINGS) ×2
CUFF TOURN SGL QUICK 34 (TOURNIQUET CUFF) ×2
CUFF TRNQT CYL 34X4X40X1 (TOURNIQUET CUFF) ×1 IMPLANT
DECANTER SPIKE VIAL GLASS SM (MISCELLANEOUS) ×3 IMPLANT
DRAPE EXTREMITY T 121X128X90 (DRAPE) ×3 IMPLANT
DRAPE POUCH INSTRU U-SHP 10X18 (DRAPES) ×3 IMPLANT
DRAPE U-SHAPE 47X51 STRL (DRAPES) ×3 IMPLANT
DRSG ADAPTIC 3X8 NADH LF (GAUZE/BANDAGES/DRESSINGS) ×3 IMPLANT
DRSG PAD ABDOMINAL 8X10 ST (GAUZE/BANDAGES/DRESSINGS) ×3 IMPLANT
DURAPREP 26ML APPLICATOR (WOUND CARE) ×3 IMPLANT
ELECT REM PT RETURN 9FT ADLT (ELECTROSURGICAL) ×3
ELECTRODE REM PT RTRN 9FT ADLT (ELECTROSURGICAL) ×1 IMPLANT
EVACUATOR 1/8 PVC DRAIN (DRAIN) ×3 IMPLANT
FACESHIELD WRAPAROUND (MASK) ×15 IMPLANT
GLOVE BIO SURGEON STRL SZ7.5 (GLOVE) IMPLANT
GLOVE BIO SURGEON STRL SZ8 (GLOVE) ×3 IMPLANT
GLOVE BIOGEL PI IND STRL 8 (GLOVE) ×2 IMPLANT
GLOVE BIOGEL PI INDICATOR 8 (GLOVE) ×4
GLOVE SURG SS PI 6.5 STRL IVOR (GLOVE) IMPLANT
GOWN STRL REUS W/TWL LRG LVL3 (GOWN DISPOSABLE) ×3 IMPLANT
GOWN STRL REUS W/TWL XL LVL3 (GOWN DISPOSABLE) IMPLANT
HANDPIECE INTERPULSE COAX TIP (DISPOSABLE) ×2
IMMOBILIZER KNEE 20 (SOFTGOODS) ×6 IMPLANT
IMMOBILIZER KNEE 20 THIGH 36 (SOFTGOODS) ×1 IMPLANT
KIT BASIN OR (CUSTOM PROCEDURE TRAY) ×3 IMPLANT
MANIFOLD NEPTUNE II (INSTRUMENTS) ×3 IMPLANT
NDL SAFETY ECLIPSE 18X1.5 (NEEDLE) ×2 IMPLANT
NEEDLE HYPO 18GX1.5 SHARP (NEEDLE) ×4
NS IRRIG 1000ML POUR BTL (IV SOLUTION) ×3 IMPLANT
PACK TOTAL JOINT (CUSTOM PROCEDURE TRAY) ×3 IMPLANT
PADDING CAST COTTON 6X4 STRL (CAST SUPPLIES) ×6 IMPLANT
POSITIONER SURGICAL ARM (MISCELLANEOUS) ×3 IMPLANT
SET HNDPC FAN SPRY TIP SCT (DISPOSABLE) ×1 IMPLANT
SPONGE GAUZE 4X4 12PLY (GAUZE/BANDAGES/DRESSINGS) ×3 IMPLANT
STRIP CLOSURE SKIN 1/2X4 (GAUZE/BANDAGES/DRESSINGS) ×4 IMPLANT
SUCTION FRAZIER 12FR DISP (SUCTIONS) ×3 IMPLANT
SUT MNCRL AB 4-0 PS2 18 (SUTURE) ×3 IMPLANT
SUT VIC AB 2-0 CT1 27 (SUTURE) ×6
SUT VIC AB 2-0 CT1 TAPERPNT 27 (SUTURE) ×3 IMPLANT
SUT VLOC 180 0 24IN GS25 (SUTURE) ×3 IMPLANT
SYR 20CC LL (SYRINGE) ×3 IMPLANT
SYR 50ML LL SCALE MARK (SYRINGE) ×3 IMPLANT
TOWEL OR 17X26 10 PK STRL BLUE (TOWEL DISPOSABLE) ×3 IMPLANT
TOWEL OR NON WOVEN STRL DISP B (DISPOSABLE) IMPLANT
TRAY FOLEY CATH 14FRSI W/METER (CATHETERS) IMPLANT
TRAY FOLEY CATH 16FR SILVER (SET/KITS/TRAYS/PACK) ×3 IMPLANT
WATER STERILE IRR 1500ML POUR (IV SOLUTION) ×3 IMPLANT
WRAP KNEE MAXI GEL POST OP (GAUZE/BANDAGES/DRESSINGS) ×3 IMPLANT

## 2013-10-04 NOTE — Interval H&P Note (Signed)
History and Physical Interval Note:  10/04/2013 1:52 PM  Karmen BongoHubert C Stolarz  has presented today for surgery, with the diagnosis of OA OF LEFT KNEE  The various methods of treatment have been discussed with the patient and family. After consideration of risks, benefits and other options for treatment, the patient has consented to  Procedure(s): LEFT TOTAL KNEE ARTHROPLASTY (Left) as a surgical intervention .  The patient's history has been reviewed, patient examined, no change in status, stable for surgery.  I have reviewed the patient's chart and labs.  Questions were answered to the patient's satisfaction.     Gus RankinFrank V Jannat Rosemeyer

## 2013-10-04 NOTE — Anesthesia Preprocedure Evaluation (Signed)
Anesthesia Evaluation  Patient identified by MRN, date of birth, ID band Patient awake    Reviewed: Allergy & Precautions, H&P , NPO status , Patient's Chart, lab work & pertinent test results  Airway Mallampati: II TM Distance: >3 FB Neck ROM: Full    Dental  (+) Dental Advisory Given   Pulmonary shortness of breath, former smoker,  breath sounds clear to auscultation        Cardiovascular hypertension, Pt. on medications + CAD Rhythm:Regular Rate:Normal     Neuro/Psych negative neurological ROS  negative psych ROS   GI/Hepatic negative GI ROS, Neg liver ROS,   Endo/Other  negative endocrine ROS  Renal/GU negative Renal ROS     Musculoskeletal negative musculoskeletal ROS (+)   Abdominal   Peds  Hematology negative hematology ROS (+)   Anesthesia Other Findings   Reproductive/Obstetrics negative OB ROS                           Anesthesia Physical Anesthesia Plan  ASA: III  Anesthesia Plan:    Post-op Pain Management:    Induction:   Airway Management Planned:   Additional Equipment:   Intra-op Plan:   Post-operative Plan:   Informed Consent: I have reviewed the patients History and Physical, chart, labs and discussed the procedure including the risks, benefits and alternatives for the proposed anesthesia with the patient or authorized representative who has indicated his/her understanding and acceptance.   Dental advisory given  Plan Discussed with: CRNA  Anesthesia Plan Comments:         Anesthesia Quick Evaluation

## 2013-10-04 NOTE — Anesthesia Procedure Notes (Signed)
Spinal  Patient location during procedure: OR Start time: 10/04/2013 2:05 PM Staffing CRNA/Resident: Dion Saucier E Performed by: resident/CRNA  Preanesthetic Checklist Completed: patient identified, site marked, surgical consent, pre-op evaluation, timeout performed, IV checked, risks and benefits discussed and monitors and equipment checked Spinal Block Patient position: sitting Prep: Betadine Patient monitoring: heart rate, continuous pulse ox and blood pressure Approach: midline Location: L2-3 Injection technique: single-shot Needle Needle type: Spinocan  Needle gauge: 22 G Needle length: 9 cm Additional Notes Kit expiration date checked.  Negative heme, paresthesias.  Clear csf.  Tolerated well.

## 2013-10-04 NOTE — Anesthesia Postprocedure Evaluation (Signed)
Anesthesia Post Note  Patient: [name]  Procedure(s) Performed: Procedure(s) (LRB): LEFT TOTAL KNEE ARTHROPLASTY (Left)  Anesthesia type: Spinal  Patient location: PACU  Post pain: Pain level controlled  Post assessment: Post-op Vital signs reviewed  Last Vitals: BP 116/65  Pulse 59  Temp(Src) 36.7 C (Oral)  Resp 16  Ht 6' (1.829 m)  Wt 247 lb (112.038 kg)  BMI 33.49 kg/m2  SpO2 98%  Post vital signs: Reviewed  Level of consciousness: sedated  Complications: No apparent anesthesia complications

## 2013-10-04 NOTE — Op Note (Signed)
Pre-operative diagnosis- Osteoarthritis  Left knee(s)  Post-operative diagnosis- Osteoarthritis Left knee(s)  Procedure-  Left  Total Knee Arthroplasty  Surgeon- Gus RankinFrank V. Darry Kelnhofer, MD  Assistant- Avel Peacerew Perkins, PA-C   Anesthesia-  Spinal  EBL-* No blood loss amount entered *   Drains Hemovac  Tourniquet time-  Total Tourniquet Time Documented: Thigh (Left) - 34 minutes Total: Thigh (Left) - 34 minutes     Complications- None  Condition-PACU - hemodynamically stable.   Brief Clinical Note   [name] is a 80 y.o. year old male with end stage OA of his left knee with progressively worsening pain and dysfunction. He has constant pain, with activity and at rest and significant functional deficits with difficulties even with ADLs. He has had extensive non-op management including analgesics, injections of cortisone and viscosupplements, and home exercise program, but remains in significant pain with significant dysfunction. Radiographs show bone on bone arthritis medial and patellofemoral. He presents now for left Total Knee Arthroplasty.     Procedure in detail---   The patient is brought into the operating room and positioned supine on the operating table. After successful administration of  Spinal,   a tourniquet is placed high on the  Left thigh(s) and the lower extremity is prepped and draped in the usual sterile fashion. Time out is performed by the operating team and then the  Left lower extremity is wrapped in Esmarch, knee flexed and the tourniquet inflated to 300 mmHg.       A midline incision is made with a ten blade through the subcutaneous tissue to the level of the extensor mechanism. A fresh blade is used to make a medial parapatellar arthrotomy. Soft tissue over the proximal medial tibia is subperiosteally elevated to the joint line with a knife and into the semimembranosus bursa with a Cobb elevator. Soft tissue over the proximal lateral tibia is elevated with attention  being paid to avoiding the patellar tendon on the tibial tubercle. The patella is everted, knee flexed 90 degrees and the ACL and PCL are removed. Findings are bone on bone medial and patellofemoral with large medial osteophytes.        The drill is used to create a starting hole in the distal femur and the canal is thoroughly irrigated with sterile saline to remove the fatty contents. The 5 degree Left  valgus alignment guide is placed into the femoral canal and the distal femoral cutting block is pinned to remove 10 mm off the distal femur. Resection is made with an oscillating saw.      The tibia is subluxed forward and the menisci are removed. The extramedullary alignment guide is placed referencing proximally at the medial aspect of the tibial tubercle and distally along the second metatarsal axis and tibial crest. The block is pinned to remove 2mm off the more deficient medial  side. Resection is made with an oscillating saw. Size 5is the most appropriate size for the tibia and the proximal tibia is prepared with the modular drill and keel punch for that size.      The femoral sizing guide is placed and size 5 is most appropriate. Rotation is marked off the epicondylar axis and confirmed by creating a rectangular flexion gap at 90 degrees. The size 5 cutting block is pinned in this rotation and the anterior, posterior and chamfer cuts are made with the oscillating saw. The intercondylar block is then placed and that cut is made.      Trial size 5  tibial component, trial size 5 posterior stabilized femur and a 10  mm posterior stabilized rotating platform insert trial is placed. Full extension is achieved with excellent varus/valgus and anterior/posterior balance throughout full range of motion. The patella is everted and thickness measured to be 27  mm. Free hand resection is taken to 15 mm, a 41 template is placed, lug holes are drilled, trial patella is placed, and it tracks normally. Osteophytes are  removed off the posterior femur with the trial in place. All trials are removed and the cut bone surfaces prepared with pulsatile lavage. Cement is mixed and once ready for implantation, the size 5 tibial implant, size  5 posterior stabilized femoral component, and the size 41 patella are cemented in place and the patella is held with the clamp. The trial insert is placed and the knee held in full extension. The Exparel (20 ml mixed with 30 ml saline) and .25% Bupivicaine, are injected into the extensor mechanism, posterior capsule, medial and lateral gutters and subcutaneous tissues.  All extruded cement is removed and once the cement is hard the permanent 10 mm posterior stabilized rotating platform insert is placed into the tibial tray.      The wound is copiously irrigated with saline solution and the extensor mechanism closed over a hemovac drain with #1 V-loc suture. The tourniquet is released for a total tourniquet time of 34  minutes. Flexion against gravity is 140 degrees and the patella tracks normally. Subcutaneous tissue is closed with 2.0 vicryl and subcuticular with running 4.0 Monocryl. The incision is cleaned and dried and steri-strips and a bulky sterile dressing are applied. The limb is placed into a knee immobilizer and the patient is awakened and transported to recovery in stable condition.      Please note that a surgical assistant was a medical necessity for this procedure in order to perform it in a safe and expeditious manner. Surgical assistant was necessary to retract the ligaments and vital neurovascular structures to prevent injury to them and also necessary for proper positioning of the limb to allow for anatomic placement of the prosthesis.   Gus RankinFrank V. Victor Delorenzo, MD    10/04/2013, 3:19 PM

## 2013-10-04 NOTE — Transfer of Care (Signed)
Immediate Anesthesia Transfer of Care Note  Patient: [name]  Procedure(s) Performed: Procedure(s): LEFT TOTAL KNEE ARTHROPLASTY (Left)  Patient Location: PACU  Anesthesia Type:Spinal  Level of Consciousness: awake, oriented, patient cooperative, lethargic and responds to stimulation  Airway & Oxygen Therapy: Patient Spontanous Breathing and Patient connected to face mask oxygen  Post-op Assessment: Report given to PACU RN and Post -op Vital signs reviewed and stable  Post vital signs: Reviewed and stable  Complications: No apparent anesthesia complications

## 2013-10-05 ENCOUNTER — Encounter (HOSPITAL_COMMUNITY): Payer: Self-pay | Admitting: Orthopedic Surgery

## 2013-10-05 LAB — CBC
HEMATOCRIT: 33.3 % — AB (ref 39.0–52.0)
Hemoglobin: 11.2 g/dL — ABNORMAL LOW (ref 13.0–17.0)
MCH: 30.7 pg (ref 26.0–34.0)
MCHC: 33.6 g/dL (ref 30.0–36.0)
MCV: 91.2 fL (ref 78.0–100.0)
Platelets: 166 10*3/uL (ref 150–400)
RBC: 3.65 MIL/uL — ABNORMAL LOW (ref 4.22–5.81)
RDW: 13.7 % (ref 11.5–15.5)
WBC: 14.3 10*3/uL — ABNORMAL HIGH (ref 4.0–10.5)

## 2013-10-05 LAB — BASIC METABOLIC PANEL
BUN: 16 mg/dL (ref 6–23)
CO2: 22 mEq/L (ref 19–32)
CREATININE: 0.75 mg/dL (ref 0.50–1.35)
Calcium: 8.2 mg/dL — ABNORMAL LOW (ref 8.4–10.5)
Chloride: 102 mEq/L (ref 96–112)
GFR, EST NON AFRICAN AMERICAN: 83 mL/min — AB (ref >90)
Glucose, Bld: 165 mg/dL — ABNORMAL HIGH (ref 70–99)
Potassium: 4.2 mEq/L (ref 3.7–5.3)
Sodium: 136 mEq/L — ABNORMAL LOW (ref 137–147)

## 2013-10-05 NOTE — Progress Notes (Addendum)
Clinical Social Work Department BRIEF PSYCHOSOCIAL ASSESSMENT 10/05/2013  Patient:  [name], [name]     Account Number:  [id number]     Admit date:  10/04/2013  Clinical Social Worker:  Domanick Cuccia LCSW  Date/Time:  10/05/2013 12:10 PM  Referred by:  Physician  Date Referred:  10/05/2013 Referred for  SNF Placement   Other Referral:   Interview type:  Patient Other interview type:    PSYCHOSOCIAL DATA Living Status:  WIFE Admitted from facility:   Level of care:   Primary support name:  Mardene Celeste Primary support relationship to patient:  SPOUSE Degree of support available:   supportive    CURRENT CONCERNS Current Concerns  Post-Acute Placement   Other Concerns:    SOCIAL WORK ASSESSMENT / PLAN Pt is an 80 yr old gentleman living at home with spouse prior to hospitalization. CSW met with pt / family to assist with d/c planning. This is a planned admission. Pt has made prior arrangements to have ST Rehab at [facility] following hospital d/c. CSW has contacted SNF and d/c plan has been confirmed. CSW will continue to follow to assist with d/c planning needs.   Assessment/plan status:  Psychosocial Support/Ongoing Assessment of Needs Other assessment/ plan:   Information/referral to community resources:   Insurance coverage for SNF and ambulance transport reviewed.    PATIENT'S/FAMILY'S RESPONSE TO PLAN OF CARE: Pt had a fairly good night following surgery. Pain is controlled. Pt is looking forward to rehab at [facility].   Werner Lean LCSW [phone]

## 2013-10-05 NOTE — Evaluation (Addendum)
Occupational Therapy Evaluation Patient Details Name: [name] MRN: [id number] DOB: Aug 20, 1927 Today's Date: 10/05/2013    History of Present Illness Pt is an 80 year old male s/p L TKR.   Clinical Impression   Pt overall at min assist level with toilet transfers today with walker. He needed min cues for hand placement and LE management but is motivated to return to independence. Will benefit from skilled OT services to improve ADL independence for next venue.    Follow Up Recommendations  SNF    Equipment Recommendations  3 in 1 bedside comode    Recommendations for Other Services       Precautions / Restrictions Precautions Precautions: Fall;Knee Required Braces or Orthoses: Knee Immobilizer - Left Knee Immobilizer - Left: Discontinue once straight leg raise with < 10 degree lag Restrictions Weight Bearing Restrictions: No Other Position/Activity Restrictions: WBAT      Mobility Bed Mobility Overal bed mobility: Needs Assistance Bed Mobility: Sit to Supine      Sit to supine: Min guard   General bed mobility comments: able to bring L LE onto bed himself.   Transfers Overall transfer level: Needs assistance Equipment used: Rolling walker (2 wheeled) Transfers: Sit to/from Stand Sit to Stand: Min assist         General transfer comment: verbal cues for hand placement and LE management. assist to rise and steady as well as control descent.     Balance                                            ADL Overall ADL's : Needs assistance/impaired Eating/Feeding: Independent;Sitting   Grooming: Wash/dry hands;Set up;Sitting   Upper Body Bathing: Set up;Sitting   Lower Body Bathing: Moderate assistance;Sit to/from stand   Upper Body Dressing : Set up;Sitting   Lower Body Dressing: Moderate assistance;Sit to/from stand   Toilet Transfer: Minimal assistance;Ambulation;RW;BSC   Toileting- Clothing Manipulation and Hygiene: Minimal  assistance;Sit to/from stand       Functional mobility during ADLs: Minimal assistance;Rolling walker General ADL Comments: Pt needs min verbal cues for hand placement and LE management. He is motivated. No complaint of dizziness with transfer into bathroom.      Vision                     Perception     Praxis      Pertinent Vitals/Pain 4-5/10 L knee; reposition, ice     Hand Dominance     Extremity/Trunk Assessment Upper Extremity Assessment Upper Extremity Assessment: Overall WFL for tasks assessed   Lower Extremity Assessment Lower Extremity Assessment: LLE deficits/detail LLE Deficits / Details: AAROM -8-55*, fair quad contraction       Communication Communication Communication: HOH   Cognition Arousal/Alertness: Awake/alert Behavior During Therapy: WFL for tasks assessed/performed Overall Cognitive Status: Within Functional Limits for tasks assessed                     General Comments       Exercises       Shoulder Instructions      Home Living Family/patient expects to be discharged to:: Skilled nursing facility Living Arrangements: Spouse/significant other                               Additional  Comments: has RW      Prior Functioning/Environment Level of Independence: Independent             OT Diagnosis: Generalized weakness   OT Problem List: Decreased strength;Decreased knowledge of use of DME or AE   OT Treatment/Interventions: Self-care/ADL training;Patient/family education;Therapeutic activities;DME and/or AE instruction    OT Goals(Current goals can be found in the care plan section) Acute Rehab OT Goals Patient Stated Goal: return to independent level. OT Goal Formulation: With patient/family Time For Goal Achievement: 10/12/13 Potential to Achieve Goals: Good  OT Frequency: Min 2X/week   Barriers to D/C:            Co-evaluation              End of Session Equipment Utilized  During Treatment: Gait belt;Rolling walker;Left knee immobilizer  Activity Tolerance: Patient tolerated treatment well Patient left: in bed;with call bell/phone within reach;with family/visitor present   Time: [id number] OT Time Calculation (min): 35 min Charges:  OT General Charges $OT Visit: 1 Procedure OT Evaluation $Initial OT Evaluation Tier I: 1 Procedure OT Treatments $Self Care/Home Management : 8-22 mins $Therapeutic Activity: 8-22 mins G-Codes:    Sabino GasserStephanie Stafford Pooja Camuso [phone] 10/05/2013, 3:00 PM

## 2013-10-05 NOTE — Progress Notes (Signed)
Physical Therapy Treatment Note   10/05/13 1500  PT Visit Information  Last PT Received On 10/05/13  Assistance Needed +1  History of Present Illness Pt is an 80 year old male s/p L TKR.  PT Time Calculation  PT Start Time 1452  PT Stop Time 1505  PT Time Calculation (min) 13 min  Subjective Data  Subjective Pt agreeable to ambulate again this afternoon with no c/o dizziness.  Pt reports pain tolerable and ice packs applied end of session.  Precautions  Precautions Fall;Knee  Required Braces or Orthoses Knee Immobilizer - Left  Knee Immobilizer - Left Discontinue once straight leg raise with < 10 degree lag  Restrictions  Other Position/Activity Restrictions WBAT  Cognition  Arousal/Alertness Awake/alert  Behavior During Therapy WFL for tasks assessed/performed  Overall Cognitive Status Within Functional Limits for tasks assessed  Bed Mobility  Overal bed mobility Needs Assistance  Bed Mobility Supine to Sit;Sit to Supine  Supine to sit Min guard  Sit to supine Min guard  General bed mobility comments verbal cues for technique, pt able to lift L LE on his own this afternoon  Transfers  Overall transfer level Needs assistance  Equipment used Rolling walker (2 wheeled)  Transfers Sit to/from Stand  Sit to Stand Min guard  General transfer comment pt verbalized safe technique and then performed correctly   Ambulation/Gait  Ambulation/Gait assistance Min guard  Ambulation Distance (Feet) 100 Feet  Assistive device Rolling walker (2 wheeled)  Gait Pattern/deviations Step-to pattern;Antalgic;Trunk flexed  Gait velocity decr  General Gait Details verbal cues for RW distance, step length and posture; pt denies dizziness  PT - End of Session  Equipment Utilized During Treatment Gait belt;Left knee immobilizer  Activity Tolerance Patient tolerated treatment well  Patient left in bed;with call bell/phone within reach;with family/visitor present  PT - Assessment/Plan  PT Plan  Current plan remains appropriate  PT Frequency 7X/week  Follow Up Recommendations SNF  PT equipment None recommended by PT  PT Goal Progression  Progress towards PT goals Progressing toward goals  PT General Charges  $$ ACUTE PT VISIT 1 Procedure  PT Treatments  $Gait Training 8-22 mins   Zenovia JarredKati Loana Salvaggio, PT, DPT 10/05/2013 Pager: [phone]

## 2013-10-05 NOTE — Progress Notes (Signed)
Utilization review completed.  

## 2013-10-05 NOTE — Progress Notes (Signed)
   Subjective: 1 Day Post-Op Procedure(s) (LRB): LEFT TOTAL KNEE ARTHROPLASTY (Left) Patient reports pain as mild.   Patient seen in rounds with Dr. Lequita HaltAluisio. Patient is well, and has had no acute complaints or problems. No issues overnight. Reports that he has been well-cared for. No SOB or chest pain.  We will start therapy today.  Plan is to go Skilled nursing facility after hospital stay.  Objective: Vital signs in last 24 hours: Temp:  [96.9 F (36.1 C)-98.1 F (36.7 C)] 97.7 F (36.5 C) (05/12 0600) Pulse Rate:  [56-76] 72 (05/12 0600) Resp:  [11-18] 17 (05/12 0600) BP: (90-142)/(53-74) 95/55 mmHg (05/12 0600) SpO2:  [96 %-100 %] 96 % (05/12 0600) Weight:  [112.038 kg (247 lb)] 112.038 kg (247 lb) (05/11 1741)  Intake/Output from previous day:  Intake/Output Summary (Last 24 hours) at 10/05/13 0735 Last data filed at 10/05/13 0600  Gross per 24 hour  Intake   2735 ml  Output   2230 ml  Net    505 ml    Intake/Output this shift:    Labs:  Recent Labs  10/05/13 0455  HGB 11.2*    Recent Labs  10/05/13 0455  WBC 14.3*  RBC 3.65*  HCT 33.3*  PLT 166    Recent Labs  10/05/13 0455  NA 136*  K 4.2  CL 102  CO2 22  BUN 16  CREATININE 0.75  GLUCOSE 165*  CALCIUM 8.2*    EXAM General - Patient is Alert and Oriented Extremity - Neurologically intact Dorsiflexion/Plantar flexion intact Compartment soft Dressing - dressing C/D/I Motor Function - intact, moving foot and toes well on exam.  Hemovac pulled without difficulty.  Past Medical History  Diagnosis Date  . Hypertension   . Shortness of breath     WITH EXERTION  . Arthritis     STATES HIS SHOULDERS ARE "FROZEN" - VERY LIMITIED RANGE OF MOTION; OA BOTH KNEES  . Hyperlipidemia   . Psoriasis   . Coronary artery disease     "MILD NONCRITICAL CORONARY ARTERY DISEASE " - PER OFFICE NOTE DR. Jacinto HalimGANJI 02/27/12 - PT NO LONGER HAS TO SEE CARDIOLGIST UNLESS HE HAS A NEED.    Assessment/Plan: 1 Day  Post-Op Procedure(s) (LRB): LEFT TOTAL KNEE ARTHROPLASTY (Left) Principal Problem:   OA (osteoarthritis) of knee  Estimated body mass index is 33.49 kg/(m^2) as calculated from the following:   Height as of this encounter: 6' (1.829 m).   Weight as of this encounter: 112.038 kg (247 lb). Advance diet Up with therapy D/C IV fluids when tolerating POs well [facility]  DVT Prophylaxis - Xarelto Weight-Bearing as tolerated  D/C O2 and Pulse OX and try on Room [employer]  Start with therapy today. [facility] Will continue to monitor fluids and BP.   Dimitri PedAmber Denetta Fei, PA-C Orthopaedic Surgery 10/05/2013, 7:35 AM

## 2013-10-05 NOTE — Evaluation (Signed)
Physical Therapy Evaluation Patient Details Name: [name] MRN: [id number] DOB: 1927-12-11 Today's Date: 10/05/2013   History of Present Illness  Pt is an 80 year old male s/p L TKR.  Clinical Impression  Pt is s/p L TKA resulting in the deficits listed below (see PT Problem List).  Pt will benefit from skilled PT to increase their independence and safety with mobility to allow discharge to the venue listed below. Pt with dizziness/lighheadedness during ambulation this session limiting distance.  Pt plans to d/c to SNF.     Follow Up Recommendations SNF    Equipment Recommendations  None recommended by PT    Recommendations for Other Services       Precautions / Restrictions Precautions Precautions: Fall;Knee Required Braces or Orthoses: Knee Immobilizer - Left Knee Immobilizer - Left: Discontinue once straight leg raise with < 10 degree lag Restrictions Other Position/Activity Restrictions: WBAT      Mobility  Bed Mobility Overal bed mobility: Needs Assistance Bed Mobility: Supine to Sit     Supine to sit: Min assist     General bed mobility comments: verbal cues for technique, assist for L LE  Transfers Overall transfer level: Needs assistance Equipment used: Rolling walker (2 wheeled) Transfers: Sit to/from Stand Sit to Stand: Min assist         General transfer comment: verbal cues for safe technique, assist to rise and steady as well as control descent  Ambulation/Gait Ambulation/Gait assistance: Min assist Ambulation Distance (Feet): 40 Feet Assistive device: Rolling walker (2 wheeled) Gait Pattern/deviations: Step-to pattern;Antalgic;Trunk flexed Gait velocity: decr   General Gait Details: verbal cues for sequence, RW distance, step length, posture; pt became lightheaded during gait which limited distance, felt better upon return to recliner with cool cloth  Stairs            Wheelchair Mobility    Modified Rankin (Stroke Patients  Only)       Balance                                             Pertinent Vitals/Pain Premedicated, activity to tolerance, ice packs applied    Home Living Family/patient expects to be discharged to:: Skilled nursing facility Living Arrangements: Spouse/significant other               Additional Comments: has RW    Prior Function Level of Independence: Independent               Hand Dominance        Extremity/Trunk Assessment               Lower Extremity Assessment: LLE deficits/detail   LLE Deficits / Details: AAROM -8-55*, fair quad contraction     Communication   Communication: HOH  Cognition Arousal/Alertness: Awake/alert Behavior During Therapy: WFL for tasks assessed/performed Overall Cognitive Status: Within Functional Limits for tasks assessed                      General Comments      Exercises Total Joint Exercises Ankle Circles/Pumps: AROM;Both;15 reps Quad Sets: AROM;Left;10 reps Towel Squeeze: AROM;Both;10 reps Short Arc QuadBarbaraann Boys: AAROM;Left;10 reps Heel Slides: AAROM;Left;10 reps Hip ABduction/ADduction: AROM;Left;10 reps Straight Leg Raises: AAROM;Left;10 reps      Assessment/Plan    PT Assessment Patient needs continued PT services  PT Diagnosis Difficulty walking;Acute pain  PT Problem List Decreased strength;Decreased mobility;Decreased range of motion;Decreased knowledge of precautions;Decreased knowledge of use of DME;Pain  PT Treatment Interventions Functional mobility training;Gait training;DME instruction;Patient/family education;Therapeutic activities;Therapeutic exercise   PT Goals (Current goals can be found in the Care Plan section) Acute Rehab PT Goals PT Goal Formulation: With patient Time For Goal Achievement: 10/09/13 Potential to Achieve Goals: Good    Frequency 7X/week   Barriers to discharge        Co-evaluation               End of Session Equipment Utilized  During Treatment: Gait belt;Left knee immobilizer Activity Tolerance: Other (comment) (limited by dizziness) Patient left: in chair;with call bell/phone within reach;with family/visitor present           Time: 1010-1040 PT Time Calculation (min): 30 min   Charges:   PT Evaluation $Initial PT Evaluation Tier I: 1 Procedure PT Treatments $Gait Training: 8-22 mins $Therapeutic Exercise: 8-22 mins   PT G CodesLynnell Catalan:          [name] 10/05/2013, 12:57 PM Zenovia JarredKati Caressa Scearce, PT, DPT 10/05/2013 Pager: [phone]

## 2013-10-05 NOTE — Care Management Note (Signed)
    Page 1 of 1   10/05/2013     1:38:15 PM CARE MANAGEMENT NOTE 10/05/2013  Patient:  [name],[name]   Account Number:  [id number]  Date Initiated:  10/05/2013  Documentation initiated by:  [facility],Arraya Buck  Subjective/Objective Assessment:   adm: left knee pain; L TOTAL KNEE ARTHROPLASTY;     Action/Plan:   discharge planning   Anticipated DC Date:  10/05/2013   Anticipated DC Plan:  SKILLED NURSING FACILITY         Choice offered to / List presented to:             Status of service:  Completed, signed off Medicare Important Message given?  YES (If response is "NO", the following Medicare IM given date fields will be blank) Date Medicare IM given:  09/23/2013 Date Additional Medicare IM given:    Discharge Disposition:  SKILLED NURSING FACILITY  Per UR Regulation:    If discussed at Long Length of Stay Meetings, dates discussed:    Comments:  10/05/13 13:00 CM notes pt to be discharge to SNF; CSW arranged.  No other CM needs were communicated.  Freddy Jakschsarah Judson Tsan, BSN, CM [phone].

## 2013-10-05 NOTE — Progress Notes (Addendum)
Clinical Social Work Department CLINICAL SOCIAL WORK PLACEMENT NOTE 10/05/2013  Patient:  [name],[name]  Account Number:  [id number] Admit date:  10/04/2013  Clinical Social Worker:  Cori RazorJAMIE HAIDINGER, LCSW  Date/time:  10/05/2013 12:30 PM  Clinical Social Work is seeking post-discharge placement for this patient at the following level of care:   SKILLED NURSING   (*CSW will update this form in Epic as items are completed)     Patient/family provided with [facility] of Clinical Social Work's list of facilities offering this level of care within the geographic area requested by the patient (or if unable, by the patient's family).  10/05/2013  Patient/family informed of their freedom to choose among providers that offer the needed level of care, that participate in Medicare, Medicaid or managed care program needed by the patient, have an available bed and are willing to accept the patient.    Patient/family informed of [facility]' ownership interest in [facility], as well as of the fact that they are under no obligation to receive care at this facility.  PASARR submitted to EDS on 10/04/2013 PASARR number received from EDS on 10/04/2013  FL2 transmitted to all facilities in geographic area requested by pt/family on  10/05/2013 FL2 transmitted to all facilities within larger geographic area on   Patient informed that his/her managed care company has contracts with or will negotiate with  certain facilities, including the following:     Patient/family informed of bed offers received:  10/05/2013 [facility] Physician recommends and patient chooses bed at    [facility]  10/07/13 Patient to be transferred to facility by [facility]  The following physician request were entered in Epic:   Additional Comments:  Cori RazorJamie Haidinger LCSW [phone]

## 2013-10-05 NOTE — Plan of Care (Signed)
Problem: Consults Goal: Diagnosis- Total Joint Replacement Outcome: Completed/Met Date Met:  10/05/13 Primary Total Knee LEFT  Problem: Phase I Progression Outcomes Goal: Dangle or out of bed evening of surgery Outcome: Not Met (add Reason) Patient refused

## 2013-10-05 NOTE — Discharge Instructions (Addendum)
° °Dr. Frank Aluisio °Total Joint Specialist °[facility] °[address] °[place], [place] [address] °[phone] ° °TOTAL KNEE REPLACEMENT POSTOPERATIVE DIRECTIONS ° ° ° °Knee Rehabilitation, Guidelines Following Surgery  °Results after knee surgery are often greatly improved when you follow the exercise, range of motion and muscle strengthening exercises prescribed by your doctor. Safety measures are also important to protect the knee from further injury. Any time any of these exercises cause you to have increased pain or swelling in your knee joint, decrease the amount until you are comfortable again and slowly increase them. If you have problems or questions, call your caregiver or physical therapist for advice.  ° °HOME CARE INSTRUCTIONS  °Remove items at home which could result in a fall. This includes throw rugs or furniture in walking pathways.  °Continue medications as instructed at time of discharge. °You may have some home medications which will be placed on hold until you complete the course of blood thinner medication.  °You may start showering once you are discharged home but do not submerge the incision under water. Just pat the incision dry and apply a dry gauze dressing on daily. °Walk with walker as instructed.  °You may resume a sexual relationship in one month or when given the OK by  your doctor.  °· Use walker as long as suggested by your caregivers. °· Avoid periods of inactivity such as sitting longer than an hour when not asleep. This helps prevent blood clots.  °You may put full weight on your legs and walk as much as is comfortable.  °You may return to work once you are cleared by your doctor.  °Do not drive a car for 6 weeks or until released by you surgeon.  °· Do not drive while taking narcotics.  °Wear the elastic stockings for three weeks following surgery during the day but you may remove then at night. °Make sure you keep all of your appointments after your  operation with all of your doctors and caregivers. You should call the office at the above phone number and make an appointment for approximately two weeks after the date of your surgery. °Change the dressing daily and reapply a dry dressing each time. °Please pick up a stool softener and laxative for home use as long as you are requiring pain medications. °· Continue to use ice on the knee for pain and swelling from surgery. You may notice swelling that will progress down to the foot and ankle.  This is normal after surgery.  Elevate the leg when you are not up walking on it.   °It is important for you to complete the blood thinner medication as prescribed by your doctor. °· Continue to use the breathing machine which will help keep your temperature down.  It is common for your temperature to cycle up and down following surgery, especially at night when you are not up moving around and exerting yourself.  The breathing machine keeps your lungs expanded and your temperature down. ° °RANGE OF MOTION AND STRENGTHENING EXERCISES  °Rehabilitation of the knee is important following a knee injury or an operation. After just a few days of immobilization, the muscles of the thigh which control the knee become weakened and shrink (atrophy). Knee exercises are designed to build up the tone and strength of the thigh muscles and to improve knee motion. Often times heat used for twenty to thirty minutes before working out will loosen up your tissues and help with improving the   range of motion but do not use heat for the first two weeks following surgery. These exercises can be done on a training (exercise) mat, on the floor, on a table or on a bed. Use what ever works the best and is most comfortable for you Knee exercises include:  Leg Lifts - While your knee is still immobilized in a splint or cast, you can do straight leg raises. Lift the leg to 60 degrees, hold for 3 sec, and slowly lower the leg. Repeat 10-20 times 2-3  times daily. Perform this exercise against resistance later as your knee gets better.  Quad and Hamstring Sets - Tighten up the muscle on the front of the thigh (Quad) and hold for 5-10 sec. Repeat this 10-20 times hourly. Hamstring sets are done by pushing the foot backward against an object and holding for 5-10 sec. Repeat as with quad sets.  A rehabilitation program following serious knee injuries can speed recovery and prevent re-injury in the future due to weakened muscles. Contact your doctor or a physical therapist for more information on knee rehabilitation.   SKILLED REHAB INSTRUCTIONS: If the patient is transferred to a skilled rehab facility following release from the hospital, a list of the current medications will be sent to the facility for the patient to continue.  When discharged from the skilled rehab facility, please have the facility set up the patient's Home Health Physical Therapy prior to being released. Also, the skilled facility will be responsible for providing the patient with their medications at time of release from the facility to include their pain medication, the muscle relaxants, and their blood thinner medication. If the patient is still at the rehab facility at time of the two week follow up appointment, the skilled rehab facility will also need to assist the patient in arranging follow up appointment in our office and any transportation needs.  MAKE SURE YOU:  Understand these instructions.  Will watch your condition.  Will get help right away if you are not doing well or get worse.    Pick up stool softner and laxative for home. Do not submerge incision under water. May shower. Continue to use ice for pain and swelling from surgery.   Take Xarelto for two and a half more weeks, then discontinue Xarelto. Once the patient has completed the Xarelto, they may resume the 81 mg Aspirin.  When discharged from the skilled rehab facility, please have the facility set  up the patient's Home Health Physical Therapy prior to being released.  Also provide the patient with their medications at time of release from the facility to include their pain medication, the muscle relaxants, and their blood thinner medication.  If the patient is still at the rehab facility at time of follow up appointment, please also assist the patient in arranging follow up appointment in our office and any transportation needs.     Information on my medicine - XARELTO (Rivaroxaban)  This medication education was reviewed with me or my healthcare representative as part of my discharge preparation.  The pharmacist that spoke with me during my hospital stay was:  Berkley HarveyJustin Marshall Legge, [facility]  Why was Xarelto prescribed for you? Xarelto was prescribed for you to reduce the risk of blood clots forming after orthopedic surgery. The medical term for these abnormal blood clots is venous thromboembolism (VTE).  What do you need to know about xarelto ? Take your Xarelto ONCE DAILY at the same time every day. You may take it  either with or without food. ° °If you have difficulty swallowing the tablet whole, you may crush it and mix in applesauce just prior to taking your dose. ° °Take Xarelto® exactly as prescribed by your doctor and DO NOT stop taking Xarelto® without talking to the doctor who prescribed the medication.  Stopping without other VTE prevention medication to take the place of Xarelto® may increase your risk of developing a clot. ° °After discharge, you should have regular check-up appointments with your healthcare provider that is prescribing your Xarelto®.   ° °What do you do if you miss a dose? °If you miss a dose, take it as soon as you remember on the same day then continue your regularly scheduled once daily regimen the next day. Do not take two doses of Xarelto® on the same day.  ° °Important Safety Information °A possible side effect of Xarelto® is bleeding. You should call your  healthcare provider right away if you experience any of the following: °  Bleeding from an injury or your nose that does not stop. °  Unusual colored urine (red or dark brown) or unusual colored stools (red or black). °  Unusual bruising for unknown reasons. °  A serious fall or if you hit your head (even if there is no bleeding). ° °Some medicines may interact with Xarelto® and might increase your risk of bleeding while on Xarelto®. To help avoid this, consult your healthcare provider or pharmacist prior to using any new prescription or non-prescription medications, including herbals, vitamins, non-steroidal anti-inflammatory drugs (NSAIDs) and supplements. ° °This website has more information on Xarelto®: www.xarelto.com. ° ° ° °

## 2013-10-06 LAB — BASIC METABOLIC PANEL
BUN: 17 mg/dL (ref 6–23)
CO2: 24 mEq/L (ref 19–32)
CREATININE: 0.77 mg/dL (ref 0.50–1.35)
Calcium: 8.6 mg/dL (ref 8.4–10.5)
Chloride: 103 mEq/L (ref 96–112)
GFR, EST NON AFRICAN AMERICAN: 82 mL/min — AB (ref >90)
Glucose, Bld: 128 mg/dL — ABNORMAL HIGH (ref 70–99)
Potassium: 4.2 mEq/L (ref 3.7–5.3)
Sodium: 137 mEq/L (ref 137–147)

## 2013-10-06 LAB — CBC
HEMATOCRIT: 29.6 % — AB (ref 39.0–52.0)
Hemoglobin: 10.1 g/dL — ABNORMAL LOW (ref 13.0–17.0)
MCH: 31.1 pg (ref 26.0–34.0)
MCHC: 34.1 g/dL (ref 30.0–36.0)
MCV: 91.1 fL (ref 78.0–100.0)
Platelets: 163 10*3/uL (ref 150–400)
RBC: 3.25 MIL/uL — ABNORMAL LOW (ref 4.22–5.81)
RDW: 14 % (ref 11.5–15.5)
WBC: 21 10*3/uL — ABNORMAL HIGH (ref 4.0–10.5)

## 2013-10-06 NOTE — Progress Notes (Signed)
Physical Therapy Treatment Patient Details Name: Victor BongoHubert C Tuel MRN: [id number] DOB: Feb 20, 1928 Today's Date: 10/06/2013    History of Present Illness Pt is an 80 year old male s/p L TKR.    PT Comments    Pt reports increased pain this morning, stated he had a difficult night but declined pain meds this morning.  Pt ambulated in hallway and performed LE exercises to tolerance.    Follow Up Recommendations  SNF     Equipment Recommendations  None recommended by PT    Recommendations for Other Services       Precautions / Restrictions Precautions Precautions: Fall;Knee Required Braces or Orthoses: Knee Immobilizer - Left Knee Immobilizer - Left: Discontinue once straight leg raise with < 10 degree lag Restrictions Weight Bearing Restrictions: No Other Position/Activity Restrictions: WBAT    Mobility  Bed Mobility Overal bed mobility: Needs Assistance Bed Mobility: Supine to Sit     Supine to sit: Min assist     General bed mobility comments: verbal cues for technique, assist for LE due to pain  Transfers Overall transfer level: Needs assistance Equipment used: Rolling walker (2 wheeled) Transfers: Sit to/from Stand Sit to Stand: Min assist;From elevated surface         General transfer comment: assist required due to pain  Ambulation/Gait Ambulation/Gait assistance: Min guard Ambulation Distance (Feet): 50 Feet Assistive device: Rolling walker (2 wheeled) Gait Pattern/deviations: Step-to pattern;Antalgic;Trunk flexed Gait velocity: decr   General Gait Details: verbal cues for RW distance, step length and posture; pt denies dizziness however reports increased pain today, encouraged WBing through UEs, smaller step length, pt reports pain improved slightly with ambulation distance   Stairs            Wheelchair Mobility    Modified Rankin (Stroke Patients Only)       Balance                                    Cognition  Arousal/Alertness: Awake/alert Behavior During Therapy: WFL for tasks assessed/performed Overall Cognitive Status: Within Functional Limits for tasks assessed                      Exercises Total Joint Exercises Ankle Circles/Pumps: AROM;Both;15 reps Quad Sets: AROM;Left;15 reps Towel Squeeze: AROM;Both;15 reps Short Arc QuadBarbaraann Boys: AAROM;Left;15 reps Heel Slides: AAROM;Left;15 reps Hip ABduction/ADduction: Left;15 reps;AAROM Goniometric ROM: knee flexion approx 50* limited by pain    General Comments        Pertinent Vitals/Pain Reports 5/10 L knee pain at rest, declined meds, ice packs applied    Home Living                      Prior Function            PT Goals (current goals can now be found in the care plan section) Progress towards PT goals: Progressing toward goals    Frequency  7X/week    PT Plan Current plan remains appropriate    Co-evaluation             End of Session Equipment Utilized During Treatment: Gait belt;Left knee immobilizer Activity Tolerance: Patient tolerated treatment well Patient left: with call bell/phone within reach;with family/visitor present;in chair     Time: 4098-11910926-0951 PT Time Calculation (min): 25 min  Charges:  $Gait Training: 8-22 mins $Therapeutic Exercise: 8-22 mins  G CodesLynnell Catalan:      Dennys Guin E Alexiss Iturralde 10/06/2013, 10:16 AM Zenovia JarredKati Bronislaus Verdell, PT, DPT 10/06/2013 Pager: [phone]

## 2013-10-06 NOTE — Progress Notes (Signed)
   Subjective: 2 Days Post-Op Procedure(s) (LRB): LEFT TOTAL KNEE ARTHROPLASTY (Left) Patient reports pain as mild and moderate.  Better this morning. Patient seen in rounds with Dr. Lequita HaltAluisio. Patient is well, but has had some minor complaints of pain in the knee, requiring pain medications Plan is to go Skilled nursing facility after hospital stay.  Objective: Vital signs in last 24 hours: Temp:  [97.2 F (36.2 C)-98.5 F (36.9 C)] 98.5 F (36.9 C) (05/13 0645) Pulse Rate:  [64-78] 74 (05/13 0645) Resp:  [16] 16 (05/13 0645) BP: (93-120)/(56-67) 118/67 mmHg (05/13 0645) SpO2:  [95 %-96 %] 96 % (05/13 0645)  Intake/Output from previous day:  Intake/Output Summary (Last 24 hours) at 10/06/13 0733 Last data filed at 10/06/13 0646  Gross per 24 hour  Intake    720 ml  Output   1900 ml  Net  -1180 ml    Intake/Output this shift:    Labs:  Recent Labs  10/05/13 0455 10/06/13 0405  HGB 11.2* 10.1*    Recent Labs  10/05/13 0455 10/06/13 0405  WBC 14.3* 21.0*  RBC 3.65* 3.25*  HCT 33.3* 29.6*  PLT 166 163    Recent Labs  10/05/13 0455 10/06/13 0405  NA 136* 137  K 4.2 4.2  CL 102 103  CO2 22 24  BUN 16 17  CREATININE 0.75 0.77  GLUCOSE 165* 128*  CALCIUM 8.2* 8.6   No results found for this basename: LABPT, INR,  in the last 72 hours  EXAM General - Patient is Alert, Appropriate and Oriented Extremity - Neurovascular intact Sensation intact distally Dorsiflexion/Plantar flexion intact Dressing/Incision - clean, dry, no drainage, healing Motor Function - intact, moving foot and toes well on exam.   Past Medical History  Diagnosis Date  . Hypertension   . Shortness of breath     WITH EXERTION  . Arthritis     STATES HIS SHOULDERS ARE "FROZEN" - VERY LIMITIED RANGE OF MOTION; OA BOTH KNEES  . Hyperlipidemia   . Psoriasis   . Coronary artery disease     "MILD NONCRITICAL CORONARY ARTERY DISEASE " - PER OFFICE NOTE DR. Jacinto HalimGANJI 02/27/12 - PT NO LONGER  HAS TO SEE CARDIOLGIST UNLESS HE HAS A NEED.    Assessment/Plan: 2 Days Post-Op Procedure(s) (LRB): LEFT TOTAL KNEE ARTHROPLASTY (Left) Principal Problem:   OA (osteoarthritis) of knee  Estimated body mass index is 33.49 kg/(m^2) as calculated from the following:   Height as of this encounter: 6' (1.829 m).   Weight as of this encounter: 112.038 kg (247 lb). Up with therapy Plan for discharge tomorrow Discharge to SNF  DVT Prophylaxis - Xarelto Weight-Bearing as tolerated to left leg  Avel Peacerew Perkins, PA-C Orthopaedic Surgery 10/06/2013, 7:33 AM

## 2013-10-06 NOTE — Progress Notes (Signed)
[employer] [facility]   Cori RazorJamie Draken Farrior LCSW [phone]

## 2013-10-06 NOTE — Progress Notes (Signed)
Physical Therapy Treatment Note   10/06/13 1500  PT Visit Information  Last PT Received On 10/06/13  Assistance Needed +1  History of Present Illness Pt is an 80 year old male s/p L TKR.  PT Time Calculation  PT Start Time 1453  PT Stop Time 1507  PT Time Calculation (min) 14 min  Subjective Data  Subjective Pt agreeable to ambulate again in hallway and then assisted back to bed.  Precautions  Precautions Fall;Knee  Required Braces or Orthoses Knee Immobilizer - Left  Knee Immobilizer - Left Discontinue once straight leg raise with < 10 degree lag  Restrictions  Other Position/Activity Restrictions WBAT  Cognition  Arousal/Alertness Awake/alert  Behavior During Therapy WFL for tasks assessed/performed  Overall Cognitive Status Within Functional Limits for tasks assessed  Bed Mobility  Overal bed mobility Needs Assistance  Bed Mobility Sit to Supine  Sit to supine Min assist  General bed mobility comments verbal cues for technique, assist for LE due to pain  Transfers  Overall transfer level Needs assistance  Equipment used Rolling walker (2 wheeled)  Transfers Sit to/from Stand  Sit to Stand Min assist  General transfer comment assist required due to pain, verbal cues for technique  Ambulation/Gait  Ambulation/Gait assistance Min guard  Ambulation Distance (Feet) 50 Feet  Assistive device Rolling walker (2 wheeled)  Gait Pattern/deviations Step-to pattern;Antalgic;Trunk flexed  Gait velocity decr  General Gait Details verbal cues for step length, posture, heel strike, distance limited due to pain  PT - End of Session  Equipment Utilized During Treatment Gait belt;Left knee immobilizer  Activity Tolerance Patient tolerated treatment well  Patient left in bed;with call bell/phone within reach  PT - Assessment/Plan  PT Plan Current plan remains appropriate  PT Frequency 7X/week  Follow Up Recommendations SNF  PT equipment None recommended by PT  PT Goal Progression   Progress towards PT goals Progressing toward goals  PT General Charges  $$ ACUTE PT VISIT 1 Procedure  PT Treatments  $Gait Training 8-22 mins   Zenovia JarredKati Keynan Heffern, PT, DPT 10/06/2013 Pager: [phone]

## 2013-10-07 DIAGNOSIS — E871 Hypo-osmolality and hyponatremia: Secondary | ICD-10-CM | POA: Diagnosis not present

## 2013-10-07 DIAGNOSIS — D62 Acute posthemorrhagic anemia: Secondary | ICD-10-CM | POA: Diagnosis not present

## 2013-10-07 LAB — CBC
HCT: 28.8 % — ABNORMAL LOW (ref 39.0–52.0)
Hemoglobin: 9.6 g/dL — ABNORMAL LOW (ref 13.0–17.0)
MCH: 31 pg (ref 26.0–34.0)
MCHC: 33.3 g/dL (ref 30.0–36.0)
MCV: 92.9 fL (ref 78.0–100.0)
Platelets: 142 10*3/uL — ABNORMAL LOW (ref 150–400)
RBC: 3.1 MIL/uL — ABNORMAL LOW (ref 4.22–5.81)
RDW: 14.7 % (ref 11.5–15.5)
WBC: 13.9 10*3/uL — ABNORMAL HIGH (ref 4.0–10.5)

## 2013-10-07 MED ORDER — BISACODYL 10 MG RE SUPP: 10.0000 mg | Freq: Every day | RECTAL | Status: DC | PRN

## 2013-10-07 MED ORDER — OXYCODONE HCL 5 MG PO TABS: 5.0000 mg | ORAL_TABLET | ORAL | Status: DC | PRN

## 2013-10-07 MED ORDER — ONDANSETRON HCL 4 MG PO TABS: 4.0000 mg | ORAL_TABLET | Freq: Four times a day (QID) | ORAL | Status: DC | PRN

## 2013-10-07 MED ORDER — METHOCARBAMOL 500 MG PO TABS: 500.0000 mg | ORAL_TABLET | Freq: Four times a day (QID) | ORAL | Status: DC | PRN

## 2013-10-07 MED ORDER — DSS 100 MG PO CAPS: 100.0000 mg | ORAL_CAPSULE | Freq: Two times a day (BID) | ORAL | Status: DC

## 2013-10-07 MED ORDER — POLYETHYLENE GLYCOL 3350 17 G PO PACK: 17.0000 g | PACK | Freq: Every day | ORAL | Status: DC | PRN

## 2013-10-07 MED ORDER — RIVAROXABAN 10 MG PO TABS: 10.0000 mg | ORAL_TABLET | Freq: Every day | ORAL | Status: DC

## 2013-10-07 MED ORDER — TRAMADOL HCL 50 MG PO TABS: 50.0000 mg | ORAL_TABLET | Freq: Four times a day (QID) | ORAL | Status: DC | PRN

## 2013-10-07 MED ORDER — ACETAMINOPHEN 325 MG PO TABS: 650.0000 mg | ORAL_TABLET | Freq: Four times a day (QID) | ORAL | Status: DC | PRN

## 2013-10-07 MED ORDER — METOCLOPRAMIDE HCL 5 MG PO TABS: 5.0000 mg | ORAL_TABLET | Freq: Three times a day (TID) | ORAL | Status: DC | PRN

## 2013-10-07 NOTE — Progress Notes (Signed)
Physical Therapy Treatment Patient Details Name: [name] C Idrovo MRN: [id number] DOB: 1927/06/14 Today's Date: 10/07/2013    History of Present Illness Pt is an [age] year old male s/p L TKR.    PT Comments    POD # 3 pt plans to D/C to [facility] Re[facility].  Instructed on KI use for amb as pt was unable to perform active SLR.  Amb limited distance in hallway.  Unsteady.  C/O increased pain "more than yesterday".  Assisted to recliner then applied ICE.    Follow Up Recommendations  SNF [facility])     Equipment Recommendations  None recommended by PT    Recommendations for Other Services       Precautions / Restrictions Precautions Precautions: Fall;Knee Precaution Comments: Instructed pt on KI use for amb and when to D/C Required Braces or Orthoses: Knee Immobilizer - Left Knee Immobilizer - Left: Discontinue once straight leg raise with < 10 degree lag Restrictions Weight Bearing Restrictions: No Other Position/Activity Restrictions: WBAT    Mobility  Bed Mobility               General bed mobility comments: Pt OOB in recliner  Transfers Overall transfer level: Needs assistance Equipment used: Rolling walker (2 wheeled) Transfers: Sit to/from Stand Sit to Stand: Min assist         General transfer comment: assist required due to pain, verbal cues for technique  Ambulation/Gait Ambulation/Gait assistance: Min guard Ambulation Distance (Feet): 50 Feet Assistive device: Rolling walker (2 wheeled) Gait Pattern/deviations: Step-to pattern;Trunk flexed;Decreased stance time - left Gait velocity: decr   General Gait Details: verbal cues for step length, posture, heel strike, distance limited due to pain.  Unsteady gait.   Stairs            Wheelchair Mobility    Modified Rankin (Stroke Patients Only)       Balance                                    Cognition                            Exercises       General Comments        Pertinent Vitals/Pain     Home Living                      Prior Function            PT Goals (current goals can now be found in the care plan section) Progress towards PT goals: Progressing toward goals    Frequency  7X/week    PT Plan      Co-evaluation             End of Session Equipment Utilized During Treatment: Gait belt;Left knee immobilizer Activity Tolerance: Patient tolerated treatment well Patient left: in chair;with call bell/phone within reach;with family/visitor present     Time: 1010-1035 PT Time Calculation (min): 25 min  Charges:  $Gait Training: 8-22 mins $Therapeutic Activity: 8-22 mins                    G Codes:      Felecia ShellingLori Jerome Viglione  PTA [facility]  Acute  Rehab Pager      [phone]

## 2013-10-07 NOTE — Progress Notes (Signed)
Clinical Social Work  CSW faxed [facility] summary [facility] is agreeable to accept patient today and can accept after 1pm today. CSW prepared DC packet with FL2 and hard scripts included. CSW updated patient, wife, and RN of DC plans and all parties agreeable. Patient prefers [facility] to transport and insurance approved #: [id number]. CSW coordinated transportation via [facility]. Request #: [id number].  CSW is signing off but available if further needs arise.  Unk LightningHolly Viola Kinnick, LCSW (Coverage for [employer])

## 2013-10-07 NOTE — Discharge Summary (Signed)
Physician Discharge Summary   Patient ID: [name] MRN: [id number] DOB/AGE: Oct 17, 1927 80 y.o.  Admit date: 10/04/2013 Discharge date: 10-07-2013  Primary Diagnosis:  Osteoarthritis Left knee(s)  Admission Diagnoses:  Past Medical History  Diagnosis Date  . Hypertension   . Shortness of breath     WITH EXERTION  . Arthritis     STATES HIS SHOULDERS ARE "FROZEN" - VERY LIMITIED RANGE OF MOTION; OA BOTH KNEES  . Hyperlipidemia   . Psoriasis   . Coronary artery disease     "MILD NONCRITICAL CORONARY ARTERY DISEASE " - PER OFFICE NOTE DR. Einar Gip 02/27/12 - PT NO LONGER HAS TO SEE CARDIOLGIST UNLESS HE HAS A NEED.   Discharge Diagnoses:   Principal Problem:   OA (osteoarthritis) of knee Active Problems:   Hyponatremia   Postoperative anemia due to acute blood loss  Estimated body mass index is 33.49 kg/(m^2) as calculated from the following:   Height as of this encounter: 6' (1.829 m).   Weight as of this encounter: 112.038 kg (247 lb).  Procedure:  Procedure(s) (LRB): LEFT TOTAL KNEE ARTHROPLASTY (Left)   Consults: None  HPI: [name] is a 80 y.o. year old male with end stage OA of his left knee with progressively worsening pain and dysfunction. He has constant pain, with activity and at rest and significant functional deficits with difficulties even with ADLs. He has had extensive non-op management including analgesics, injections of cortisone and viscosupplements, and home exercise program, but remains in significant pain with significant dysfunction. Radiographs show bone on bone arthritis medial and patellofemoral. He presents now for left Total Knee Arthroplasty.   Laboratory Data: Admission on 10/04/2013  Component Date Value Ref Range Status  . ABO/RH(D) 10/04/2013 O POS   Final  . Antibody Screen 10/04/2013 NEG   Final  . Sample Expiration 10/04/2013 10/18/2013   Final  . ABO/RH(D) 10/04/2013 O POS   Final  . WBC 10/05/2013 14.3* 4.0 - 10.5 K/uL Final    . RBC 10/05/2013 3.65* 4.22 - 5.81 MIL/uL Final  . Hemoglobin 10/05/2013 11.2* 13.0 - 17.0 g/dL Final  . HCT 10/05/2013 33.3* 39.0 - 52.0 % Final  . MCV 10/05/2013 91.2  78.0 - 100.0 fL Final  . MCH 10/05/2013 30.7  26.0 - 34.0 pg Final  . MCHC 10/05/2013 33.6  30.0 - 36.0 g/dL Final  . RDW 10/05/2013 13.7  11.5 - 15.5 % Final  . Platelets 10/05/2013 166  150 - 400 K/uL Final  . Sodium 10/05/2013 136* 137 - 147 mEq/L Final  . Potassium 10/05/2013 4.2  3.7 - 5.3 mEq/L Final  . Chloride 10/05/2013 102  96 - 112 mEq/L Final  . CO2 10/05/2013 22  19 - 32 mEq/L Final  . Glucose, Bld 10/05/2013 165* 70 - 99 mg/dL Final  . BUN 10/05/2013 16  6 - 23 mg/dL Final  . Creatinine, Ser 10/05/2013 0.75  0.50 - 1.35 mg/dL Final  . Calcium 10/05/2013 8.2* 8.4 - 10.5 mg/dL Final  . GFR calc non Af Amer 10/05/2013 83* >90 mL/min Final  . GFR calc Af Amer 10/05/2013 >90  >90 mL/min Final   Comment: (NOTE)                          The eGFR has been calculated using the CKD EPI equation.  This calculation has not been validated in all clinical situations.                          eGFR's persistently <90 mL/min signify possible Chronic Kidney                          Disease.  . WBC 10/06/2013 21.0* 4.0 - 10.5 K/uL Final  . RBC 10/06/2013 3.25* 4.22 - 5.81 MIL/uL Final  . Hemoglobin 10/06/2013 10.1* 13.0 - 17.0 g/dL Final  . HCT 10/06/2013 29.6* 39.0 - 52.0 % Final  . MCV 10/06/2013 91.1  78.0 - 100.0 fL Final  . MCH 10/06/2013 31.1  26.0 - 34.0 pg Final  . MCHC 10/06/2013 34.1  30.0 - 36.0 g/dL Final  . RDW 10/06/2013 14.0  11.5 - 15.5 % Final  . Platelets 10/06/2013 163  150 - 400 K/uL Final  . Sodium 10/06/2013 137  137 - 147 mEq/L Final  . Potassium 10/06/2013 4.2  3.7 - 5.3 mEq/L Final  . Chloride 10/06/2013 103  96 - 112 mEq/L Final  . CO2 10/06/2013 24  19 - 32 mEq/L Final  . Glucose, Bld 10/06/2013 128* 70 - 99 mg/dL Final  . BUN 10/06/2013 17  6 - 23 mg/dL Final  .  Creatinine, Ser 10/06/2013 0.77  0.50 - 1.35 mg/dL Final  . Calcium 10/06/2013 8.6  8.4 - 10.5 mg/dL Final  . GFR calc non Af Amer 10/06/2013 82* >90 mL/min Final  . GFR calc Af Amer 10/06/2013 >90  >90 mL/min Final   Comment: (NOTE)                          The eGFR has been calculated using the CKD EPI equation.                          This calculation has not been validated in all clinical situations.                          eGFR's persistently <90 mL/min signify possible Chronic Kidney                          Disease.  . WBC 10/07/2013 13.9* 4.0 - 10.5 K/uL Final  . RBC 10/07/2013 3.10* 4.22 - 5.81 MIL/uL Final  . Hemoglobin 10/07/2013 9.6* 13.0 - 17.0 g/dL Final  . HCT 10/07/2013 28.8* 39.0 - 52.0 % Final  . MCV 10/07/2013 92.9  78.0 - 100.0 fL Final  . MCH 10/07/2013 31.0  26.0 - 34.0 pg Final  . MCHC 10/07/2013 33.3  30.0 - 36.0 g/dL Final  . RDW 10/07/2013 14.7  11.5 - 15.5 % Final  . Platelets 10/07/2013 142* 150 - 400 K/uL Final  Hospital Outpatient Visit on 09/23/2013  Component Date Value Ref Range Status  . MRSA, PCR 09/23/2013 NEGATIVE  NEGATIVE Final  . Staphylococcus aureus 09/23/2013 NEGATIVE  NEGATIVE Final   Comment:                                 The Xpert SA Assay (FDA  approved for NASAL specimens                          in patients over 68 years of age),                          is one component of                          a comprehensive surveillance                          program.  Test performance has                          been validated by [employer] for patients greater                          than or equal to 28 year old.                          It is not intended                          to diagnose infection nor to                          guide or monitor treatment.  . Sodium 09/23/2013 138  137 - 147 mEq/L Final  . Potassium 09/23/2013 5.0  3.7 - 5.3 mEq/L Final  . Chloride 09/23/2013  101  96 - 112 mEq/L Final  . CO2 09/23/2013 24  19 - 32 mEq/L Final  . Glucose, Bld 09/23/2013 87  70 - 99 mg/dL Final  . BUN 09/23/2013 20  6 - 23 mg/dL Final  . Creatinine, Ser 09/23/2013 0.86  0.50 - 1.35 mg/dL Final  . Calcium 09/23/2013 9.2  8.4 - 10.5 mg/dL Final  . Total Protein 09/23/2013 7.5  6.0 - 8.3 g/dL Final  . Albumin 09/23/2013 3.9  3.5 - 5.2 g/dL Final  . AST 09/23/2013 26  0 - 37 U/L Final   Comment: SLIGHT HEMOLYSIS                          HEMOLYSIS AT THIS LEVEL MAY AFFECT RESULT  . ALT 09/23/2013 28  0 - 53 U/L Final  . Alkaline Phosphatase 09/23/2013 69  39 - 117 U/L Final  . Total Bilirubin 09/23/2013 0.4  0.3 - 1.2 mg/dL Final  . GFR calc non Af Amer 09/23/2013 78* >90 mL/min Final  . GFR calc Af Amer 09/23/2013 >90  >90 mL/min Final   Comment: (NOTE)                          The eGFR has been calculated using the CKD EPI equation.                          This calculation has not been validated in all clinical situations.  eGFR's persistently <90 mL/min signify possible Chronic Kidney                          Disease.  . WBC 09/23/2013 9.2  4.0 - 10.5 K/uL Final  . RBC 09/23/2013 4.55  4.22 - 5.81 MIL/uL Final  . Hemoglobin 09/23/2013 14.2  13.0 - 17.0 g/dL Final  . HCT 09/23/2013 41.6  39.0 - 52.0 % Final  . MCV 09/23/2013 91.4  78.0 - 100.0 fL Final  . MCH 09/23/2013 31.2  26.0 - 34.0 pg Final  . MCHC 09/23/2013 34.1  30.0 - 36.0 g/dL Final  . RDW 09/23/2013 13.9  11.5 - 15.5 % Final  . Platelets 09/23/2013 185  150 - 400 K/uL Final  . Prothrombin Time 09/23/2013 13.5  11.6 - 15.2 seconds Final  . INR 09/23/2013 1.05  0.00 - 1.49 Final  . aPTT 09/23/2013 32  24 - 37 seconds Final  . Color, Urine 09/23/2013 YELLOW  YELLOW Final  . APPearance 09/23/2013 CLEAR  CLEAR Final  . Specific Gravity, Urine 09/23/2013 1.024  1.005 - 1.030 Final  . pH 09/23/2013 6.0  5.0 - 8.0 Final  . Glucose, UA 09/23/2013 NEGATIVE  NEGATIVE mg/dL Final    . Hgb urine dipstick 09/23/2013 NEGATIVE  NEGATIVE Final  . Bilirubin Urine 09/23/2013 NEGATIVE  NEGATIVE Final  . Ketones, ur 09/23/2013 NEGATIVE  NEGATIVE mg/dL Final  . Protein, ur 09/23/2013 NEGATIVE  NEGATIVE mg/dL Final  . Urobilinogen, UA 09/23/2013 1.0  0.0 - 1.0 mg/dL Final  . Nitrite 09/23/2013 NEGATIVE  NEGATIVE Final  . Leukocytes, UA 09/23/2013 NEGATIVE  NEGATIVE Final   MICROSCOPIC NOT DONE ON URINES WITH NEGATIVE PROTEIN, BLOOD, LEUKOCYTES, NITRITE, OR GLUCOSE <1000 mg/dL.     X-Rays:Dg Chest 2 View  09/23/2013   CLINICAL DATA:  Hypertension.  EXAM: CHEST  2 VIEW  COMPARISON:  None.  FINDINGS: Cardiac silhouette is normal in size and configuration. Normal mediastinal and hilar contours.  Clear lungs.  No pleural effusion.  No pneumothorax.  Bony thorax is demineralized but intact.  IMPRESSION: No active cardiopulmonary disease.   Electronically Signed   By: Lajean Manes M.D.   On: 09/23/2013 15:23    EKG: Orders placed during the hospital encounter of 09/23/13  . EKG 12-LEAD  . EKG 12-LEAD     Hospital Course: [name] is a 80 y.o. who was admitted to [facility]. They were brought to the operating room on 10/04/2013 and underwent Procedure(s): LEFT TOTAL KNEE ARTHROPLASTY.  Patient tolerated the procedure well and was later transferred to the recovery room and then to the orthopaedic floor for postoperative care.  They were given PO and IV analgesics for pain control following their surgery.  They were given 24 hours of postoperative antibiotics of  Anti-infectives   Start     Dose/Rate Route Frequency Ordered Stop   10/04/13 2000  ceFAZolin (ANCEF) IVPB 2 g/50 mL premix     2 g 100 mL/hr over 30 Minutes Intravenous Every 6 hours 10/04/13 1756 10/05/13 0155   10/04/13 1027  ceFAZolin (ANCEF) IVPB 2 g/50 mL premix     2 g 100 mL/hr over 30 Minutes Intravenous On call to O.R. 10/04/13 1027 10/04/13 1400     and started on DVT prophylaxis in the form of  Xarelto.   PT and OT were ordered for total joint protocol.  Discharge planning consulted to help with postop disposition and equipment needs.  Social worker got involved to assist with placement of this patient.  Patient had a decent night on the evening of surgery.  They started to get up OOB with therapy on day one. Hemovac drain was pulled without difficulty.  Continued to work with therapy into day two.  Dressing was changed on day two and the incision was healing well.  By day three, the patient had progressed with therapy and meeting their goals.  Incision was healing well.  Patient was seen in rounds and was ready to go home.  Discharge to SNF  Diet - Cardiac diet  Follow up - in 2 weeks  Activity - WBAT  Disposition - Skilled nursing facility - [facility]  Condition Upon Discharge - Good  D/C Meds - See DC Summary  DVT Prophylaxis - Xarelto    Discharge Orders   Future Orders Complete By Expires   Call MD / Call 911  As directed    Change dressing  As directed    Constipation Prevention  As directed    Diet - low sodium heart healthy  As directed    Discharge instructions  As directed    Do not put a pillow under the knee. Place it under the heel.  As directed    Do not sit on low chairs, stoools or toilet seats, as it may be difficult to get up from low surfaces  As directed    Driving restrictions  As directed    Increase activity slowly as tolerated  As directed    Lifting restrictions  As directed    Patient may shower  As directed    TED hose  As directed    Weight bearing as tolerated  As directed    Questions:     Laterality:     Extremity:         Medication List    STOP taking these medications       aspirin EC 81 MG tablet     cholecalciferol 1000 UNITS tablet  Commonly known as:  VITAMIN D     Co Q 10 100 MG Caps     HUMIRA 40 MG/0.8ML injection  Generic drug:  adalimumab     multivitamin with minerals Tabs tablet     omega-3 acid ethyl esters 1  G capsule  Commonly known as:  LOVAZA      TAKE these medications       acetaminophen 325 MG tablet  Commonly known as:  TYLENOL  Take 2 tablets (650 mg total) by mouth every 6 (six) hours as needed for mild pain (or Fever >/= 101).     bisacodyl 10 MG suppository  Commonly known as:  DULCOLAX  Place 1 suppository (10 mg total) rectally daily as needed for mild constipation or moderate constipation.     DSS 100 MG Caps  Take 100 mg by mouth 2 (two) times daily.     magnesium oxide 400 MG tablet  Commonly known as:  MAG-OX  Take 400 mg by mouth every other day. ALTERNATES WITH ASPIRIN EVERY OTHER DAY     methocarbamol 500 MG tablet  Commonly known as:  ROBAXIN  Take 1 tablet (500 mg total) by mouth every 6 (six) hours as needed for muscle spasms.     metoCLOPramide 5 MG tablet  Commonly known as:  REGLAN  Take 1-2 tablets (5-10 mg total) by mouth every 8 (eight) hours as needed for nausea (if ondansetron (ZOFRAN) ineffective.).  ondansetron 4 MG tablet  Commonly known as:  ZOFRAN  Take 1 tablet (4 mg total) by mouth every 6 (six) hours as needed for nausea.     oxyCODONE 5 MG immediate release tablet  Commonly known as:  Oxy IR/ROXICODONE  Take 1-2 tablets (5-10 mg total) by mouth every 3 (three) hours as needed for moderate pain, severe pain or breakthrough pain.     polyethylene glycol packet  Commonly known as:  MIRALAX / GLYCOLAX  Take 17 g by mouth daily as needed for mild constipation.     rivaroxaban 10 MG Tabs tablet  Commonly known as:  XARELTO  - Take 1 tablet (10 mg total) by mouth daily with breakfast. Take Xarelto for two and a half more weeks, then discontinue Xarelto.  - Once the patient has completed the Xarelto, they may resume the 81 mg Aspirin.     simvastatin 80 MG tablet  Commonly known as:  ZOCOR  Take 40 mg by mouth at bedtime.     traMADol 50 MG tablet  Commonly known as:  ULTRAM  Take 1-2 tablets (50-100 mg total) by mouth every 6 (six)  hours as needed (mild to moderate pain).     triamcinolone cream 0.1 %  Commonly known as:  KENALOG  Apply 1 application topically 2 (two) times daily.           Follow-up Information   Follow up with Gearlean Alf, MD. Schedule an appointment as soon as possible for a visit in 2 weeks.   Specialty:  Orthopedic Surgery   Contact information:   [address] [place] [address] [phone]       Signed: Arlee Muslim, PA-C Orthopaedic Surgery 10/07/2013, 8:24 AM

## 2013-10-07 NOTE — Progress Notes (Signed)
   Subjective: 3 Days Post-Op Procedure(s) (LRB): LEFT TOTAL KNEE ARTHROPLASTY (Left) Patient reports pain as mild.   Patient seen in rounds by Dr. Lequita HaltAluisio. Patient is well, and has had no acute complaints or problems Patient is ready to go to the SNF [facility].  Objective: Vital signs in last 24 hours: Temp:  [97.9 F (36.6 C)-98.8 F (37.1 C)] 98.8 F (37.1 C) (05/14 0608) Pulse Rate:  [68-87] 87 (05/14 0608) Resp:  [18] 18 (05/14 0608) BP: (97-116)/(49-63) 103/49 mmHg (05/14 0608) SpO2:  [94 %-98 %] 94 % (05/14 0608)  Intake/Output from previous day:  Intake/Output Summary (Last 24 hours) at 10/07/13 0813 Last data filed at 10/07/13 0742  Gross per 24 hour  Intake    240 ml  Output   1675 ml  Net  -1435 ml    Intake/Output this shift: Total I/O In: -  Out: 125 [Urine:125]  Labs:  Recent Labs  10/05/13 0455 10/06/13 0405 10/07/13 0405  HGB 11.2* 10.1* 9.6*    Recent Labs  10/06/13 0405 10/07/13 0405  WBC 21.0* 13.9*  RBC 3.25* 3.10*  HCT 29.6* 28.8*  PLT 163 142*    Recent Labs  10/05/13 0455 10/06/13 0405  NA 136* 137  K 4.2 4.2  CL 102 103  CO2 22 24  BUN 16 17  CREATININE 0.75 0.77  GLUCOSE 165* 128*  CALCIUM 8.2* 8.6   No results found for this basename: LABPT, INR,  in the last 72 hours  EXAM: General - Patient is Alert, Appropriate and Oriented Extremity - Neurovascular intact Sensation intact distally Dorsiflexion/Plantar flexion intact Incision - clean, dry, no drainage, healing Motor Function - intact, moving foot and toes well on exam.   Assessment/Plan: 3 Days Post-Op Procedure(s) (LRB): LEFT TOTAL KNEE ARTHROPLASTY (Left) Procedure(s) (LRB): LEFT TOTAL KNEE ARTHROPLASTY (Left) Past Medical History  Diagnosis Date  . Hypertension   . Shortness of breath     WITH EXERTION  . Arthritis     STATES HIS SHOULDERS ARE "FROZEN" - VERY LIMITIED RANGE OF MOTION; OA BOTH KNEES  . Hyperlipidemia   . Psoriasis   .  Coronary artery disease     "MILD NONCRITICAL CORONARY ARTERY DISEASE " - PER OFFICE NOTE DR. Jacinto HalimGANJI 02/27/12 - PT NO LONGER HAS TO SEE CARDIOLGIST UNLESS HE HAS A NEED.   Principal Problem:   OA (osteoarthritis) of knee Active Problems:   Hyponatremia   Postoperative anemia due to acute blood loss  Estimated body mass index is 33.49 kg/(m^2) as calculated from the following:   Height as of this encounter: 6' (1.829 m).   Weight as of this encounter: 112.038 kg (247 lb). Up with therapy Discharge to SNF Diet - Cardiac diet Follow up - in 2 weeks Activity - WBAT Disposition - Skilled nursing facility - [facility] Condition Upon Discharge - Good D/C Meds - See DC Summary DVT Prophylaxis - Xarelto  Avel Peacerew Dasani Thurlow, PA-C Orthopaedic Surgery 10/07/2013, 8:13 AM

## 2014-10-04 ENCOUNTER — Ambulatory Visit: Payer: Self-pay | Admitting: Orthopedic Surgery

## 2014-10-04 NOTE — Progress Notes (Signed)
Preoperative surgical orders have been place into the Epic hospital system for [name] on 10/04/2014, 11:00 AM  by Patrica DuelPERKINS, Alcie Runions for surgery on 10-31-2014.  Preop Total Knee orders including Experal, IV Tylenol, and IV Decadron as long as there are no contraindications to the above medications. [name], PA-C

## 2014-10-25 NOTE — Patient Instructions (Addendum)
YOUR PROCEDURE IS SCHEDULED ON :  10/31/14  REPORT TO [facility] MAIN ENTRANCE FOLLOW SIGNS TO SHORT STAY CENTER AT :  8:30 AM  CALL THIS NUMBER IF YOU HAVE PROBLEMS THE MORNING OF SURGERY [phone]  REMEMBER:  DO NOT EAT FOOD OR DRINK LIQUIDS AFTER MIDNIGHT  TAKE THESE MEDICINES THE MORNING OF SURGERY: NONE  YOU MAY NOT HAVE ANY METAL ON YOUR BODY INCLUDING HAIR PINS AND PIERCING'S. DO NOT WEAR JEWELRY, MAKEUP, LOTIONS, POWDERS OR PERFUMES. DO NOT WEAR NAIL POLISH. DO NOT SHAVE 48 HRS PRIOR TO SURGERY. MEN MAY SHAVE FACE AND NECK.  DO NOT BRING VALUABLES TO HOSPITAL. [facility] IS NOT RESPONSIBLE FOR VALUABLES.  CONTACTS, DENTURES OR PARTIALS MAY NOT BE WORN TO SURGERY. LEAVE SUITCASE IN CAR. CAN BE BROUGHT TO ROOM AFTER SURGERY.  PATIENTS DISCHARGED THE DAY OF SURGERY WILL NOT BE ALLOWED TO DRIVE HOME.  PLEASE READ OVER THE FOLLOWING INSTRUCTION SHEETS _________________________________________________________________________________                                          [place] - PREPARING FOR SURGERY  Before surgery, you can play an important role.  Because skin is not sterile, your skin needs to be as free of germs as possible.  You can reduce the number of germs on your skin by washing with CHG (chlorahexidine gluconate) soap before surgery.  CHG is an antiseptic cleaner which kills germs and bonds with the skin to continue killing germs even after washing. Please DO NOT use if you have an allergy to CHG or antibacterial soaps.  If your skin becomes reddened/irritated stop using the CHG and inform your nurse when you arrive at Short Stay. Do not shave (including legs and underarms) for at least 48 hours prior to the first CHG shower.  You may shave your face. Please follow these instructions carefully:   1.  Shower with CHG Soap the night before surgery and the  morning of Surgery.   2.  If you choose to wash your hair, wash your hair first as  usual with your  normal  Shampoo.   3.  After you shampoo, rinse your hair and body thoroughly to remove the  shampoo.                                         4.  Use CHG as you would any other liquid soap.  You can apply chg directly  to the skin and wash . Gently wash with scrungie or clean wascloth    5.  Apply the CHG Soap to your body ONLY FROM THE NECK DOWN.   Do not use on open                           Wound or open sores. Avoid contact with eyes, ears mouth and genitals (private parts).                        Genitals (private parts) with your normal soap.              6.  Wash thoroughly, paying special attention to the area where your surgery  will be performed.  7.  Thoroughly rinse your body with warm water from the neck down.   8.  DO NOT shower/wash with your normal soap after using and rinsing off  the CHG Soap .                9.  Pat yourself dry with a clean towel.             10.  Wear clean night clothes to bed after shower             11.  Place clean sheets on your bed the night of your first shower and do not  sleep with pets.  Day of Surgery : Do not apply any lotions/deodorants the morning of surgery.  Please wear clean clothes to the hospital/surgery center.  FAILURE TO FOLLOW THESE INSTRUCTIONS MAY RESULT IN THE CANCELLATION OF YOUR SURGERY    PATIENT SIGNATURE_________________________________  ______________________________________________________________________     Adam Phenix  An incentive spirometer is a tool that can help keep your lungs clear and active. This tool measures how well you are filling your lungs with each breath. Taking long deep breaths may help reverse or decrease the chance of developing breathing (pulmonary) problems (especially infection) following:  A long period of time when you are unable to move or be active. BEFORE THE PROCEDURE   If the spirometer includes an indicator to show your best effort, your nurse  or respiratory therapist will set it to a desired goal.  If possible, sit up straight or lean slightly forward. Try not to slouch.  Hold the incentive spirometer in an upright position. INSTRUCTIONS FOR USE   Sit on the edge of your bed if possible, or sit up as far as you can in bed or on a chair.  Hold the incentive spirometer in an upright position.  Breathe out normally.  Place the mouthpiece in your mouth and seal your lips tightly around it.  Breathe in slowly and as deeply as possible, raising the piston or the ball toward the top of the column.  Hold your breath for 3-5 seconds or for as long as possible. Allow the piston or ball to fall to the bottom of the column.  Remove the mouthpiece from your mouth and breathe out normally.  Rest for a few seconds and repeat Steps 1 through 7 at least 10 times every 1-2 hours when you are awake. Take your time and take a few normal breaths between deep breaths.  The spirometer may include an indicator to show your best effort. Use the indicator as a goal to work toward during each repetition.  After each set of 10 deep breaths, practice coughing to be sure your lungs are clear. If you have an incision (the cut made at the time of surgery), support your incision when coughing by placing a pillow or rolled up towels firmly against it. Once you are able to get out of bed, walk around indoors and cough well. You may stop using the incentive spirometer when instructed by your caregiver.  RISKS AND COMPLICATIONS  Take your time so you do not get dizzy or light-headed.  If you are in pain, you may need to take or ask for pain medication before doing incentive spirometry. It is harder to take a deep breath if you are having pain. AFTER USE  Rest and breathe slowly and easily.  It can be helpful to keep track of a log of your progress. Your caregiver can provide you  with a simple table to help with this. If you are using the spirometer at  home, follow these instructions: [place] IF:   You are having difficultly using the spirometer.  You have trouble using the spirometer as often as instructed.  Your pain medication is not giving enough relief while using the spirometer.  You develop fever of 100.5 F (38.1 C) or higher. SEEK IMMEDIATE MEDICAL CARE IF:   You cough up bloody sputum that had not been present before.  You develop fever of 102 F (38.9 C) or greater.  You develop worsening pain at or near the incision site. MAKE SURE YOU:   Understand these instructions.  Will watch your condition.  Will get help right away if you are not doing well or get worse. Document Released: 09/23/2006 Document Revised: 08/05/2011 Document Reviewed: 11/24/2006 Exit[employer] Patient Information 2014 [employer], [place].   ________________________________________________________________________  WHAT IS A BLOOD TRANSFUSION? Blood Transfusion Information  A transfusion is the replacement of blood or some of its parts. Blood is made up of multiple cells which provide different functions.  Red blood cells carry oxygen and are used for blood loss replacement.  White blood cells fight against infection.  Platelets control bleeding.  Plasma helps clot blood.  Other blood products are available for specialized needs, such as hemophilia or other clotting disorders. BEFORE THE TRANSFUSION  Who gives blood for transfusions?   Healthy volunteers who are fully evaluated to make sure their blood is safe. This is blood bank blood. Transfusion therapy is the safest it has ever been in the practice of medicine. Before blood is taken from a donor, a complete history is taken to make sure that person has no history of diseases nor engages in risky social behavior (examples are intravenous drug use or sexual activity with multiple partners). The donor's travel history is screened to minimize risk of transmitting infections, such as  malaria. The donated blood is tested for signs of infectious diseases, such as HIV and hepatitis. The blood is then tested to be sure it is compatible with you in order to minimize the chance of a transfusion reaction. If you or a relative donates blood, this is often done in anticipation of surgery and is not appropriate for emergency situations. It takes many days to process the donated blood. RISKS AND COMPLICATIONS Although transfusion therapy is very safe and saves many lives, the main dangers of transfusion include:   Getting an infectious disease.  Developing a transfusion reaction. This is an allergic reaction to something in the blood you were given. Every precaution is taken to prevent this. The decision to have a blood transfusion has been considered carefully by your caregiver before blood is given. Blood is not given unless the benefits outweigh the risks. AFTER THE TRANSFUSION  Right after receiving a blood transfusion, you will usually feel much better and more energetic. This is especially true if your red blood cells have gotten low (anemic). The transfusion raises the level of the red blood cells which carry oxygen, and this usually causes an energy increase.  The nurse administering the transfusion will monitor you carefully for complications. HOME CARE INSTRUCTIONS  No special instructions are needed after a transfusion. You may find your energy is better. Speak with your caregiver about any limitations on activity for underlying diseases you may have. SEEK MEDICAL CARE IF:   Your condition is not improving after your transfusion.  You develop redness or irritation at the intravenous (IV) site.  SEEK IMMEDIATE MEDICAL CARE IF:  Any of the following symptoms occur over the next 12 hours:  Shaking chills.  You have a temperature by mouth above 102 F (38.9 C), not controlled by medicine.  Chest, back, or muscle pain.  People around you feel you are not acting correctly  or are confused.  Shortness of breath or difficulty breathing.  Dizziness and fainting.  You get a rash or develop hives.  You have a decrease in urine output.  Your urine turns a dark color or changes to pink, red, or brown. Any of the following symptoms occur over the next 10 days:  You have a temperature by mouth above 102 F (38.9 C), not controlled by medicine.  Shortness of breath.  Weakness after normal activity.  The white part of the eye turns yellow (jaundice).  You have a decrease in the amount of urine or are urinating less often.  Your urine turns a dark color or changes to pink, red, or brown. Document Released: 05/10/2000 Document Revised: 08/05/2011 Document Reviewed: 12/28/2007 [facility] Patient Information 2014 [place], [place].  _______________________________________________________________________

## 2014-10-26 ENCOUNTER — Encounter (HOSPITAL_COMMUNITY)
Admission: RE | Admit: 2014-10-26 | Discharge: 2014-10-26 | Disposition: A | Discharge status: Home / Self Care | Payer: PPO | Source: Ambulatory Visit | Attending: Orthopedic Surgery | Admitting: Orthopedic Surgery

## 2014-10-26 ENCOUNTER — Encounter (HOSPITAL_COMMUNITY): Payer: Self-pay

## 2014-10-26 DIAGNOSIS — Z01810 Encounter for preprocedural cardiovascular examination: Secondary | ICD-10-CM | POA: Insufficient documentation

## 2014-10-26 DIAGNOSIS — Z01812 Encounter for preprocedural laboratory examination: Secondary | ICD-10-CM | POA: Insufficient documentation

## 2014-10-26 DIAGNOSIS — M1711 Unilateral primary osteoarthritis, right knee: Secondary | ICD-10-CM | POA: Diagnosis not present

## 2014-10-26 HISTORY — DX: Personal history of urinary (tract) infections: Z87.440

## 2014-10-26 LAB — URINALYSIS, ROUTINE W REFLEX MICROSCOPIC
Bilirubin Urine: NEGATIVE
GLUCOSE, UA: NEGATIVE mg/dL
Hgb urine dipstick: NEGATIVE
Ketones, ur: NEGATIVE mg/dL
Leukocytes, UA: NEGATIVE
Nitrite: NEGATIVE
Protein, ur: NEGATIVE mg/dL
SPECIFIC GRAVITY, URINE: 1.019 (ref 1.005–1.030)
Urobilinogen, UA: 0.2 mg/dL (ref 0.0–1.0)
pH: 7 (ref 5.0–8.0)

## 2014-10-26 LAB — CBC
HCT: 42.3 % (ref 39.0–52.0)
HEMOGLOBIN: 13.8 g/dL (ref 13.0–17.0)
MCH: 31.4 pg (ref 26.0–34.0)
MCHC: 32.6 g/dL (ref 30.0–36.0)
MCV: 96.1 fL (ref 78.0–100.0)
PLATELETS: 195 10*3/uL (ref 150–400)
RBC: 4.4 MIL/uL (ref 4.22–5.81)
RDW: 13.5 % (ref 11.5–15.5)
WBC: 5.4 10*3/uL (ref 4.0–10.5)

## 2014-10-26 LAB — APTT: aPTT: 33 seconds (ref 24–37)

## 2014-10-26 LAB — COMPREHENSIVE METABOLIC PANEL
ALT: 25 U/L (ref 17–63)
ANION GAP: 9 (ref 5–15)
AST: 24 U/L (ref 15–41)
Albumin: 4.2 g/dL (ref 3.5–5.0)
Alkaline Phosphatase: 61 U/L (ref 38–126)
BUN: 16 mg/dL (ref 6–20)
CO2: 27 mmol/L (ref 22–32)
CREATININE: 0.95 mg/dL (ref 0.61–1.24)
Calcium: 9.4 mg/dL (ref 8.9–10.3)
Chloride: 108 mmol/L (ref 101–111)
GFR calc Af Amer: >60 mL/min (ref >60)
Glucose, Bld: 99 mg/dL (ref 65–99)
POTASSIUM: 5.2 mmol/L — AB (ref 3.5–5.1)
SODIUM: 144 mmol/L (ref 135–145)
TOTAL PROTEIN: 7.7 g/dL (ref 6.5–8.1)
Total Bilirubin: 0.8 mg/dL (ref 0.3–1.2)

## 2014-10-26 LAB — SURGICAL PCR SCREEN
MRSA, PCR: NEGATIVE
Staphylococcus aureus: NEGATIVE

## 2014-10-26 LAB — PROTIME-INR
INR: 1.09 (ref 0.00–1.49)
Prothrombin Time: 14.3 seconds (ref 11.6–15.2)

## 2014-10-26 NOTE — Progress Notes (Signed)
   10/26/14 0952  OBSTRUCTIVE SLEEP APNEA  Have you ever been diagnosed with sleep apnea through a sleep study? No  Do you snore loudly (loud enough to be heard through closed doors)?  0  Do you often feel tired, fatigued, or sleepy during the daytime? 0  Has anyone observed you stop breathing during your sleep? 1  Do you have, or are you being treated for high blood pressure? 0  BMI more than 35 kg/m2? 0  Age over 79 years old? 1  Neck circumference greater than 40 cm/16 inches? 1  Gender: 1

## 2014-10-30 ENCOUNTER — Ambulatory Visit: Payer: Self-pay | Admitting: Orthopedic Surgery

## 2014-10-30 NOTE — H&P (Signed)
[name] DOB: 1927-06-04 Married / Language: English / Race: White Male Date of Admission:  10/31/2014 CC:  Right Knee Pain History of Present Illness  The patient is a 81 year old male who comes in  for a preoperative History and Physical. The patient is scheduled for a right total knee arthroplasty to be performed by Dr. Gus Rankin. Aluisio, MD at [facility] on 10-31-2014. The patient is a 81 year old male presenting for a post-operative visit ssevearl months out from left total knee arthroplasty. The patient states that he is doing well at this time. At times he feels some stiffness in the knee but he feels that is mostly due to the fact that he is overworking the left knee because the right knee is so bad. The patient is being followed for their right osteoarthritis. They are also many month(s) out from cortisone injection. Symptoms reported today include: pain and stiffness. Current treatment includes: using a cane. The following medication has been used for pain control: none. He does not feel that the cortisone injection helped him at all. He is at a point now where he is ready to have the right knee replaced. It is bothering him at all times, especially with weightbearing. Unfortunately, the right knee has gotten progressively worse. If anything, it hurts him more than the left knee did prior to when he had surgery on the left knee. He says the left knee is doing great at this time and he is now ready to proceed with the right knee at this time. They have been treated conservatively in the past for the above stated problem and despite conservative measures, they continue to have progressive pain and severe functional limitations and dysfunction. They have failed non-operative management including home exercise, medications, and injections. It is felt that they would benefit from undergoing total joint replacement. Risks and benefits of the procedure have been discussed with the patient  and they elect to proceed with surgery. There are no active contraindications to surgery such as ongoing infection or rapidly progressive neurological disease.  Problem List/Past Medical  Primary osteoarthritis of right knee (M17.11) Status post total left knee replacement (Z96.652)10/04/2013 Tinnitus Hypercholesterolemia Urinary Tract Infection Measles Mumps  Allergies  No Known Drug Allergies  Family History  No pertinent family history First Degree Relatives. First Degree Relatives reported  Social History Tobacco use Former smoker. 04/01/2013: smoke(d) less than 1/2 pack(s) per day uses less than 1/2 can(s) smokeless per week Current work status retired Exercise Exercises rarely; does running / walking Children 2 Current drinker 04/01/2013: Currently drinks beer, wine and hard liquor only occasionally per week Living situation live with spouse Not under pain contract Number of flights of stairs before winded 2-3 No history of drug/alcohol rehab Marital status married  Medication History Simvastatin (  Tablet, Oral) Active. Zocor (  Tablet, Oral) Active. Vitamin D (Oral) Specific dose unknown - Active. Magnesium Gluconate (Oral) Specific dose unknown - Active. (every other day) Aspirin EC (  Tablet DR, Oral) Active. Fish Oil Burp-Less (  Capsule, Oral) Active. Multivitamins (Oral) Active. Humira Pen (Subcutaneous) Specific dose unknown - Active.  Past Surgical History  Vasectomy Total Knee Replacement - Left  Review of Systems  General Not Present- Chills, Fatigue, Fever, Memory Loss, Night Sweats, Weight Gain and Weight Loss. Skin Not Present- Eczema, Hives, Itching, Lesions and Rash. HEENT Not Present- Dentures, Double Vision, Headache, Hearing Loss, Tinnitus and Visual Loss. Respiratory Not Present- Allergies, Chronic Cough, Coughing up blood, Shortness of  breath at rest and Shortness of breath with  exertion. Cardiovascular Not Present- Chest Pain, Difficulty Breathing Lying Down, Murmur, Palpitations, Racing/skipping heartbeats and Swelling. Gastrointestinal Not Present- Abdominal Pain, Bloody Stool, Constipation, Diarrhea, Difficulty Swallowing, Heartburn, Jaundice, Loss of appetitie, Nausea and Vomiting. Male Genitourinary Not Present- Blood in Urine, Discharge, Flank Pain, Incontinence, Painful Urination, Urgency, Urinary frequency, Urinary Retention, Urinating at Night and Weak urinary stream. Musculoskeletal Present- Joint Pain. Not Present- Back Pain, Joint Swelling, Morning Stiffness, Muscle Pain, Muscle Weakness and Spasms. Neurological Not Present- Blackout spells, Difficulty with balance, Dizziness, Paralysis, Tremor and Weakness. Psychiatric Not Present- Insomnia.  Vitals  Weight: 243 lb Height: 72in Body Surface Area: 2.31 m Body Mass Index: 32.96 kg/m  BP: 124/64 (Sitting, Left Arm, Standard)   Physical Exam General Mental Status -Alert, cooperative and good historian. General Appearance-pleasant, Not in acute distress. Orientation-Oriented X3. Build & Nutrition-Well nourished and Well developed.  Head and Neck Head-normocephalic, atraumatic . Neck Global Assessment - supple, no bruit auscultated on the right, no bruit auscultated on the left.  Eye Vision-Wears corrective lenses. Pupil - Bilateral-Regular and Round. Motion - Bilateral-EOMI.  ENMT Note: bilateral hearing aids   Chest and Lung Exam Auscultation Breath sounds - clear at anterior chest wall and clear at posterior chest wall. Adventitious sounds - No Adventitious sounds.  Cardiovascular Auscultation Rhythm - Regular rate and rhythm. Heart Sounds - S1 WNL and S2 WNL. Murmurs & Other Heart Sounds - Auscultation of the heart reveals - No Murmurs.  Abdomen Palpation/Percussion Tenderness - Abdomen is non-tender to palpation. Rigidity (guarding) - Abdomen is  soft. Auscultation Auscultation of the abdomen reveals - Bowel sounds normal.  Male Genitourinary Note: Not done, not pertinent to present illness   Musculoskeletal Note: On exam, he is alert and oriented and in no apparent distress. His right knee shows no swelling. There is slight varus range 5 to 120 degrees. He has moderate crepitus on range of motion, and tender medial greater than lateral with no instability. The left knee looks great. There is no swelling. Range of motion is 0 to 122 degrees. There is no tenderness or instability noted.  RADIOGRAPHS: His radiographs, AP of both knees and lateral show the prosthesis on the left is in excellent position with no periprosthetic abnormalities. On the right, he has signifcant medial and patellofemoral bone on bone with a varus deformity present.  Assessment & Plan Status post total left knee replacement (W11.914(Z96.652) Primary osteoarthritis of right knee (M17.11) Note:Surgical Plans: Right Total Knee Replacement  Disposition: [facility]  PCP: Dr. Pearson GrippeJames Kim - Patient has been seen preoperatively and felt to be stable for surgery.  IV TXA  Anesthesia Issues: None  Signed electronically by Lauraine RinneAlexzandrew L Tomasa Dobransky, III PA-C

## 2014-10-31 ENCOUNTER — Inpatient Hospital Stay (HOSPITAL_COMMUNITY): Payer: PPO | Admitting: Certified Registered"

## 2014-10-31 ENCOUNTER — Encounter (HOSPITAL_COMMUNITY): Payer: Self-pay

## 2014-10-31 ENCOUNTER — Encounter (HOSPITAL_COMMUNITY)
Admission: RE | Disposition: A | Discharge status: Skilled Nursing Facility | Payer: Self-pay | Source: Ambulatory Visit | Attending: Orthopedic Surgery

## 2014-10-31 ENCOUNTER — Inpatient Hospital Stay (HOSPITAL_COMMUNITY)
Admission: RE | Admit: 2014-10-31 | Discharge: 2014-11-02 | DRG: 470 | Disposition: A | Discharge status: Skilled Nursing Facility | Payer: PPO | Source: Ambulatory Visit | Attending: Orthopedic Surgery | Admitting: Orthopedic Surgery

## 2014-10-31 DIAGNOSIS — Z87891 Personal history of nicotine dependence: Secondary | ICD-10-CM

## 2014-10-31 DIAGNOSIS — M1710 Unilateral primary osteoarthritis, unspecified knee: Secondary | ICD-10-CM | POA: Diagnosis present

## 2014-10-31 DIAGNOSIS — M25561 Pain in right knee: Secondary | ICD-10-CM | POA: Diagnosis present

## 2014-10-31 DIAGNOSIS — E780 Pure hypercholesterolemia: Secondary | ICD-10-CM | POA: Diagnosis present

## 2014-10-31 DIAGNOSIS — I2510 Atherosclerotic heart disease of native coronary artery without angina pectoris: Secondary | ICD-10-CM | POA: Diagnosis present

## 2014-10-31 DIAGNOSIS — M1711 Unilateral primary osteoarthritis, right knee: Secondary | ICD-10-CM | POA: Diagnosis present

## 2014-10-31 DIAGNOSIS — Z96652 Presence of left artificial knee joint: Secondary | ICD-10-CM | POA: Diagnosis present

## 2014-10-31 DIAGNOSIS — E785 Hyperlipidemia, unspecified: Secondary | ICD-10-CM | POA: Diagnosis present

## 2014-10-31 DIAGNOSIS — M179 Osteoarthritis of knee, unspecified: Secondary | ICD-10-CM | POA: Diagnosis present

## 2014-10-31 HISTORY — PX: TOTAL KNEE ARTHROPLASTY: SHX125

## 2014-10-31 LAB — TYPE AND SCREEN
ABO/RH(D): O POS
ANTIBODY SCREEN: NEGATIVE

## 2014-10-31 SURGERY — ARTHROPLASTY, KNEE, TOTAL
Anesthesia: Spinal | Site: Knee | Laterality: Right

## 2014-10-31 MED ORDER — RIVAROXABAN 10 MG PO TABS
10.0000 mg | ORAL_TABLET | Freq: Every day | ORAL | Status: DC
  Administered 2014-11-01 – 2014-11-02 (×2): 10 mg via ORAL
  Filled 2014-10-31 (×3): qty 1

## 2014-10-31 MED ORDER — SODIUM CHLORIDE 0.9 % IJ SOLN
INTRAMUSCULAR | Status: DC | PRN
  Administered 2014-10-31: 50 mL

## 2014-10-31 MED ORDER — DEXAMETHASONE SODIUM PHOSPHATE 10 MG/ML IJ SOLN
INTRAMUSCULAR | Status: AC
  Filled 2014-10-31: qty 1

## 2014-10-31 MED ORDER — LIDOCAINE HCL (CARDIAC) 20 MG/ML IV SOLN
INTRAVENOUS | Status: AC
  Filled 2014-10-31: qty 5

## 2014-10-31 MED ORDER — SODIUM CHLORIDE 0.9 % IJ SOLN
INTRAMUSCULAR | Status: AC
  Filled 2014-10-31: qty 50

## 2014-10-31 MED ORDER — FENTANYL CITRATE (PF) 100 MCG/2ML IJ SOLN
INTRAMUSCULAR | Status: AC
  Filled 2014-10-31: qty 2

## 2014-10-31 MED ORDER — METHOCARBAMOL 500 MG PO TABS
500.0000 mg | ORAL_TABLET | Freq: Four times a day (QID) | ORAL | Status: DC | PRN
  Administered 2014-11-01: 500 mg via ORAL
  Filled 2014-10-31: qty 1

## 2014-10-31 MED ORDER — ACETAMINOPHEN 650 MG RE SUPP: 650.0000 mg | Freq: Four times a day (QID) | RECTAL | Status: DC | PRN

## 2014-10-31 MED ORDER — METOCLOPRAMIDE HCL 5 MG/ML IJ SOLN: 5.0000 mg | Freq: Three times a day (TID) | INTRAMUSCULAR | Status: DC | PRN

## 2014-10-31 MED ORDER — PROMETHAZINE HCL 25 MG/ML IJ SOLN: 6.2500 mg | INTRAMUSCULAR | Status: DC | PRN

## 2014-10-31 MED ORDER — KETOROLAC TROMETHAMINE 15 MG/ML IJ SOLN: 7.5000 mg | Freq: Four times a day (QID) | INTRAMUSCULAR | Status: AC | PRN

## 2014-10-31 MED ORDER — BUPIVACAINE LIPOSOME 1.3 % IJ SUSP
INTRAMUSCULAR | Status: DC | PRN
  Administered 2014-10-31: 20 mL

## 2014-10-31 MED ORDER — ONDANSETRON HCL 4 MG/2ML IJ SOLN: 4.0000 mg | Freq: Four times a day (QID) | INTRAMUSCULAR | Status: DC | PRN

## 2014-10-31 MED ORDER — ACETAMINOPHEN 10 MG/ML IV SOLN
1000.0000 mg | Freq: Once | INTRAVENOUS | Status: AC
  Administered 2014-10-31: 1000 mg via INTRAVENOUS
  Filled 2014-10-31: qty 100

## 2014-10-31 MED ORDER — TRANEXAMIC ACID 1000 MG/10ML IV SOLN
2000.0000 mg | Freq: Once | INTRAVENOUS | Status: DC
  Filled 2014-10-31: qty 20

## 2014-10-31 MED ORDER — SODIUM CHLORIDE 0.9 % IV SOLN: INTRAVENOUS | Status: DC

## 2014-10-31 MED ORDER — ONDANSETRON HCL 4 MG/2ML IJ SOLN
INTRAMUSCULAR | Status: DC | PRN
  Administered 2014-10-31: 4 mg via INTRAVENOUS

## 2014-10-31 MED ORDER — TRANEXAMIC ACID 1000 MG/10ML IV SOLN
1000.0000 mg | INTRAVENOUS | Status: DC
  Filled 2014-10-31: qty 10

## 2014-10-31 MED ORDER — BUPIVACAINE LIPOSOME 1.3 % IJ SUSP
20.0000 mL | Freq: Once | INTRAMUSCULAR | Status: DC
  Filled 2014-10-31 (×2): qty 20

## 2014-10-31 MED ORDER — MEPERIDINE HCL 50 MG/ML IJ SOLN: 6.2500 mg | INTRAMUSCULAR | Status: DC | PRN

## 2014-10-31 MED ORDER — DIPHENHYDRAMINE HCL 12.5 MG/5ML PO ELIX: 12.5000 mg | ORAL_SOLUTION | ORAL | Status: DC | PRN

## 2014-10-31 MED ORDER — PHENYLEPHRINE HCL 10 MG/ML IJ SOLN
INTRAMUSCULAR | Status: DC | PRN
  Administered 2014-10-31 (×3): 80 ug via INTRAVENOUS
  Administered 2014-10-31: 40 ug via INTRAVENOUS

## 2014-10-31 MED ORDER — BISACODYL 10 MG RE SUPP: 10.0000 mg | Freq: Every day | RECTAL | Status: DC | PRN

## 2014-10-31 MED ORDER — DEXTROSE-NACL 5-0.45 % IV SOLN
INTRAVENOUS | Status: DC
  Administered 2014-10-31: 15:00:00 via INTRAVENOUS

## 2014-10-31 MED ORDER — BUPIVACAINE HCL (PF) 0.25 % IJ SOLN
INTRAMUSCULAR | Status: AC
  Filled 2014-10-31: qty 30

## 2014-10-31 MED ORDER — METOCLOPRAMIDE HCL 10 MG PO TABS: 5.0000 mg | ORAL_TABLET | Freq: Three times a day (TID) | ORAL | Status: DC | PRN

## 2014-10-31 MED ORDER — PROPOFOL INFUSION 10 MG/ML OPTIME
INTRAVENOUS | Status: DC | PRN
  Administered 2014-10-31: 100 ug/kg/min via INTRAVENOUS

## 2014-10-31 MED ORDER — ACETAMINOPHEN 325 MG PO TABS: 650.0000 mg | ORAL_TABLET | Freq: Four times a day (QID) | ORAL | Status: DC | PRN

## 2014-10-31 MED ORDER — TRANEXAMIC ACID 1000 MG/10ML IV SOLN
2000.0000 mg | INTRAVENOUS | Status: DC | PRN
  Administered 2014-10-31: 2000 mg via TOPICAL

## 2014-10-31 MED ORDER — CEFAZOLIN SODIUM-DEXTROSE 2-3 GM-% IV SOLR
2.0000 g | INTRAVENOUS | Status: AC
  Administered 2014-10-31: 2 g via INTRAVENOUS

## 2014-10-31 MED ORDER — SODIUM CHLORIDE 0.9 % IR SOLN
Status: DC | PRN
  Administered 2014-10-31: 1000 mL

## 2014-10-31 MED ORDER — HYDROMORPHONE HCL 2 MG PO TABS
2.0000 mg | ORAL_TABLET | ORAL | Status: DC | PRN
  Administered 2014-11-01 – 2014-11-02 (×5): 2 mg via ORAL
  Filled 2014-10-31: qty 1
  Filled 2014-10-31: qty 2
  Filled 2014-10-31 (×3): qty 1

## 2014-10-31 MED ORDER — ACETAMINOPHEN 500 MG PO TABS
1000.0000 mg | ORAL_TABLET | Freq: Four times a day (QID) | ORAL | Status: AC
  Administered 2014-10-31 – 2014-11-01 (×4): 1000 mg via ORAL
  Filled 2014-10-31 (×5): qty 2

## 2014-10-31 MED ORDER — DEXAMETHASONE SODIUM PHOSPHATE 10 MG/ML IJ SOLN
10.0000 mg | Freq: Once | INTRAMUSCULAR | Status: AC
  Administered 2014-10-31: 10 mg via INTRAVENOUS

## 2014-10-31 MED ORDER — LIDOCAINE HCL (CARDIAC) 20 MG/ML IV SOLN
INTRAVENOUS | Status: DC | PRN
  Administered 2014-10-31: 50 mg via INTRAVENOUS

## 2014-10-31 MED ORDER — METHOCARBAMOL 1000 MG/10ML IJ SOLN
500.0000 mg | Freq: Four times a day (QID) | INTRAVENOUS | Status: DC | PRN
  Filled 2014-10-31: qty 5

## 2014-10-31 MED ORDER — HYDROMORPHONE HCL 1 MG/ML IJ SOLN
0.5000 mg | INTRAMUSCULAR | Status: DC | PRN
  Administered 2014-10-31: 1 mg via INTRAVENOUS
  Filled 2014-10-31: qty 1

## 2014-10-31 MED ORDER — DOCUSATE SODIUM 100 MG PO CAPS
100.0000 mg | ORAL_CAPSULE | Freq: Two times a day (BID) | ORAL | Status: DC
  Administered 2014-10-31 – 2014-11-02 (×3): 100 mg via ORAL

## 2014-10-31 MED ORDER — FLEET ENEMA 7-19 GM/118ML RE ENEM: 1.0000 | ENEMA | Freq: Once | RECTAL | Status: AC | PRN

## 2014-10-31 MED ORDER — 0.9 % SODIUM CHLORIDE (POUR BTL) OPTIME
TOPICAL | Status: DC | PRN
  Administered 2014-10-31: 1000 mL

## 2014-10-31 MED ORDER — ONDANSETRON HCL 4 MG/2ML IJ SOLN
INTRAMUSCULAR | Status: AC
  Filled 2014-10-31: qty 2

## 2014-10-31 MED ORDER — BUPIVACAINE HCL 0.25 % IJ SOLN
INTRAMUSCULAR | Status: DC | PRN
Start: 2014-10-31 — End: 2014-10-31
  Administered 2014-10-31: 30 mL

## 2014-10-31 MED ORDER — BUPIVACAINE IN DEXTROSE 0.75-8.25 % IT SOLN
INTRATHECAL | Status: DC | PRN
  Administered 2014-10-31: 1.8 mL via INTRATHECAL

## 2014-10-31 MED ORDER — FENTANYL CITRATE (PF) 100 MCG/2ML IJ SOLN: 25.0000 ug | INTRAMUSCULAR | Status: DC | PRN

## 2014-10-31 MED ORDER — PROPOFOL 10 MG/ML IV BOLUS
INTRAVENOUS | Status: AC
  Filled 2014-10-31: qty 20

## 2014-10-31 MED ORDER — CEFAZOLIN SODIUM-DEXTROSE 2-3 GM-% IV SOLR
2.0000 g | Freq: Four times a day (QID) | INTRAVENOUS | Status: AC
  Administered 2014-10-31 (×2): 2 g via INTRAVENOUS
  Filled 2014-10-31 (×3): qty 50

## 2014-10-31 MED ORDER — PHENYLEPHRINE 40 MCG/ML (10ML) SYRINGE FOR IV PUSH (FOR BLOOD PRESSURE SUPPORT)
PREFILLED_SYRINGE | INTRAVENOUS | Status: AC
  Filled 2014-10-31: qty 10

## 2014-10-31 MED ORDER — CEFAZOLIN SODIUM-DEXTROSE 2-3 GM-% IV SOLR
INTRAVENOUS | Status: AC
  Filled 2014-10-31: qty 50

## 2014-10-31 MED ORDER — CHLORHEXIDINE GLUCONATE 4 % EX LIQD: 60.0000 mL | Freq: Once | CUTANEOUS | Status: DC

## 2014-10-31 MED ORDER — ONDANSETRON HCL 4 MG PO TABS: 4.0000 mg | ORAL_TABLET | Freq: Four times a day (QID) | ORAL | Status: DC | PRN

## 2014-10-31 MED ORDER — LACTATED RINGERS IV SOLN
INTRAVENOUS | Status: DC | PRN
  Administered 2014-10-31 (×2): via INTRAVENOUS

## 2014-10-31 MED ORDER — DEXAMETHASONE SODIUM PHOSPHATE 10 MG/ML IJ SOLN
10.0000 mg | Freq: Once | INTRAMUSCULAR | Status: DC
  Filled 2014-10-31: qty 1

## 2014-10-31 MED ORDER — PHENOL 1.4 % MT LIQD
1.0000 | OROMUCOSAL | Status: DC | PRN
Start: 2014-10-31 — End: 2014-11-02

## 2014-10-31 MED ORDER — MENTHOL 3 MG MT LOZG: 1.0000 | LOZENGE | OROMUCOSAL | Status: DC | PRN

## 2014-10-31 MED ORDER — POLYETHYLENE GLYCOL 3350 17 G PO PACK: 17.0000 g | PACK | Freq: Every day | ORAL | Status: DC | PRN

## 2014-10-31 MED ORDER — FENTANYL CITRATE (PF) 100 MCG/2ML IJ SOLN
INTRAMUSCULAR | Status: DC | PRN
  Administered 2014-10-31: 100 ug via INTRAVENOUS

## 2014-10-31 SURGICAL SUPPLY — 60 items
BAG DECANTER FOR FLEXI CONT (MISCELLANEOUS) ×3 IMPLANT
BAG ZIPLOCK 12X15 (MISCELLANEOUS) ×3 IMPLANT
BANDAGE ELASTIC 6 VELCRO ST LF (GAUZE/BANDAGES/DRESSINGS) ×3 IMPLANT
BANDAGE ESMARK 6X9 LF (GAUZE/BANDAGES/DRESSINGS) ×1 IMPLANT
BLADE SAG 18X100X1.27 (BLADE) ×3 IMPLANT
BLADE SAW SGTL 11.0X1.19X90.0M (BLADE) ×3 IMPLANT
BNDG ESMARK 6X9 LF (GAUZE/BANDAGES/DRESSINGS) ×3
BOWL SMART MIX CTS (DISPOSABLE) ×3 IMPLANT
CAP KNEE TOTAL 3 SIGMA ×3 IMPLANT
CEMENT HV SMART SET (Cement) ×6 IMPLANT
CLOSURE WOUND 1/2 X4 (GAUZE/BANDAGES/DRESSINGS) ×1
CUFF TOURN SGL QUICK 34 (TOURNIQUET CUFF) ×2
CUFF TRNQT CYL 34X4X40X1 (TOURNIQUET CUFF) ×1 IMPLANT
DECANTER SPIKE VIAL GLASS SM (MISCELLANEOUS) ×3 IMPLANT
DRAPE EXTREMITY T 121X128X90 (DRAPE) ×3 IMPLANT
DRAPE POUCH INSTRU U-SHP 10X18 (DRAPES) ×3 IMPLANT
DRAPE U-SHAPE 47X51 STRL (DRAPES) ×3 IMPLANT
DRSG ADAPTIC 3X8 NADH LF (GAUZE/BANDAGES/DRESSINGS) ×3 IMPLANT
DRSG PAD ABDOMINAL 8X10 ST (GAUZE/BANDAGES/DRESSINGS) ×3 IMPLANT
DURAPREP 26ML APPLICATOR (WOUND CARE) ×3 IMPLANT
ELECT REM PT RETURN 9FT ADLT (ELECTROSURGICAL) ×3
ELECTRODE REM PT RTRN 9FT ADLT (ELECTROSURGICAL) ×1 IMPLANT
EVACUATOR 1/8 PVC DRAIN (DRAIN) ×3 IMPLANT
FACESHIELD WRAPAROUND (MASK) ×15 IMPLANT
GAUZE SPONGE 4X4 12PLY STRL (GAUZE/BANDAGES/DRESSINGS) ×3 IMPLANT
GLOVE BIO SURGEON STRL SZ7.5 (GLOVE) IMPLANT
GLOVE BIO SURGEON STRL SZ8 (GLOVE) ×3 IMPLANT
GLOVE BIOGEL PI IND STRL 6.5 (GLOVE) IMPLANT
GLOVE BIOGEL PI IND STRL 8 (GLOVE) ×1 IMPLANT
GLOVE BIOGEL PI INDICATOR 6.5 (GLOVE)
GLOVE BIOGEL PI INDICATOR 8 (GLOVE) ×2
GLOVE SURG SS PI 6.5 STRL IVOR (GLOVE) IMPLANT
GOWN STRL REUS W/TWL LRG LVL3 (GOWN DISPOSABLE) ×3 IMPLANT
GOWN STRL REUS W/TWL XL LVL3 (GOWN DISPOSABLE) IMPLANT
HANDPIECE INTERPULSE COAX TIP (DISPOSABLE) ×2
IMMOBILIZER KNEE 20 (SOFTGOODS) ×3 IMPLANT
KIT BASIN OR (CUSTOM PROCEDURE TRAY) ×3 IMPLANT
MANIFOLD NEPTUNE II (INSTRUMENTS) ×3 IMPLANT
NDL SAFETY ECLIPSE 18X1.5 (NEEDLE) ×2 IMPLANT
NEEDLE HYPO 18GX1.5 SHARP (NEEDLE) ×4
NS IRRIG 1000ML POUR BTL (IV SOLUTION) ×3 IMPLANT
PACK TOTAL JOINT (CUSTOM PROCEDURE TRAY) ×3 IMPLANT
PADDING CAST COTTON 6X4 STRL (CAST SUPPLIES) ×3 IMPLANT
PEN SKIN MARKING BROAD (MISCELLANEOUS) ×3 IMPLANT
POSITIONER SURGICAL ARM (MISCELLANEOUS) ×3 IMPLANT
SET HNDPC FAN SPRY TIP SCT (DISPOSABLE) ×1 IMPLANT
STRIP CLOSURE SKIN 1/2X4 (GAUZE/BANDAGES/DRESSINGS) ×2 IMPLANT
SUCTION FRAZIER 12FR DISP (SUCTIONS) ×3 IMPLANT
SUT MNCRL AB 4-0 PS2 18 (SUTURE) ×3 IMPLANT
SUT VIC AB 2-0 CT1 27 (SUTURE) ×6
SUT VIC AB 2-0 CT1 TAPERPNT 27 (SUTURE) ×3 IMPLANT
SUT VLOC 180 0 24IN GS25 (SUTURE) ×3 IMPLANT
SYR 20CC LL (SYRINGE) ×3 IMPLANT
SYR 50ML LL SCALE MARK (SYRINGE) ×3 IMPLANT
TOWEL OR 17X26 10 PK STRL BLUE (TOWEL DISPOSABLE) ×3 IMPLANT
TOWEL OR NON WOVEN STRL DISP B (DISPOSABLE) IMPLANT
TRAY FOLEY CATH 16FR SILVER (SET/KITS/TRAYS/PACK) ×3 IMPLANT
WATER STERILE IRR 1500ML POUR (IV SOLUTION) ×3 IMPLANT
WRAP KNEE MAXI GEL POST OP (GAUZE/BANDAGES/DRESSINGS) ×3 IMPLANT
YANKAUER SUCT BULB TIP 10FT TU (MISCELLANEOUS) ×3 IMPLANT

## 2014-10-31 NOTE — Anesthesia Preprocedure Evaluation (Signed)
Anesthesia Evaluation  Patient identified by MRN, date of birth, ID band Patient awake    Reviewed: Allergy & Precautions, H&P , NPO status , Patient's Chart, lab work & pertinent test results  Airway Mallampati: II  TM Distance: >3 FB Neck ROM: Full    Dental  (+) Dental Advisory Given   Pulmonary shortness of breath, former smoker,  breath sounds clear to auscultation        Cardiovascular hypertension, Pt. on medications + CAD Rhythm:Regular Rate:Normal     Neuro/Psych negative neurological ROS  negative psych ROS   GI/Hepatic negative GI ROS, Neg liver ROS,   Endo/Other  negative endocrine ROS  Renal/GU negative Renal ROS     Musculoskeletal negative musculoskeletal ROS (+)   Abdominal   Peds  Hematology negative hematology ROS (+)   Anesthesia Other Findings   Reproductive/Obstetrics negative OB ROS                             Anesthesia Physical  Anesthesia Plan  ASA: III  Anesthesia Plan: Spinal   Post-op Pain Management:    Induction:   Airway Management Planned: Simple Face Mask  Additional Equipment:   Intra-op Plan:   Post-operative Plan:   Informed Consent: I have reviewed the patients History and Physical, chart, labs and discussed the procedure including the risks, benefits and alternatives for the proposed anesthesia with the patient or authorized representative who has indicated his/her understanding and acceptance.   Dental advisory given  Plan Discussed with: CRNA  Anesthesia Plan Comments:         Anesthesia Quick Evaluation

## 2014-10-31 NOTE — H&P (View-Only) (Signed)
[name] DOB: 07/31/1927 Married / Language: English / Race: White Male Date of Admission:  10/31/2014 CC:  Right Knee Pain History of Present Illness  The patient is a 81 year old male who comes in  for a preoperative History and Physical. The patient is scheduled for a right total knee arthroplasty to be performed by Dr. Frank V. Aluisio, MD at [facility] on 10-31-2014. The patient is a 81 year old male presenting for a post-operative visit ssevearl months out from left total knee arthroplasty. The patient states that he is doing well at this time. At times he feels some stiffness in the knee but he feels that is mostly due to the fact that he is overworking the left knee because the right knee is so bad. The patient is being followed for their right osteoarthritis. They are also many month(s) out from cortisone injection. Symptoms reported today include: pain and stiffness. Current treatment includes: using a cane. The following medication has been used for pain control: none. He does not feel that the cortisone injection helped him at all. He is at a point now where he is ready to have the right knee replaced. It is bothering him at all times, especially with weightbearing. Unfortunately, the right knee has gotten progressively worse. If anything, it hurts him more than the left knee did prior to when he had surgery on the left knee. He says the left knee is doing great at this time and he is now ready to proceed with the right knee at this time. They have been treated conservatively in the past for the above stated problem and despite conservative measures, they continue to have progressive pain and severe functional limitations and dysfunction. They have failed non-operative management including home exercise, medications, and injections. It is felt that they would benefit from undergoing total joint replacement. Risks and benefits of the procedure have been discussed with the patient  and they elect to proceed with surgery. There are no active contraindications to surgery such as ongoing infection or rapidly progressive neurological disease.  Problem List/Past Medical  Primary osteoarthritis of right knee (M17.11) Status post total left knee replacement (Z96.652)10/04/2013 Tinnitus Hypercholesterolemia Urinary Tract Infection Measles Mumps  Allergies  No Known Drug Allergies  Family History  No pertinent family history First Degree Relatives. First Degree Relatives reported  Social History Tobacco use Former smoker. 04/01/2013: smoke(d) less than 1/2 pack(s) per day uses less than 1/2 can(s) smokeless per week Current work status retired Exercise Exercises rarely; does running / walking Children 2 Current drinker 04/01/2013: Currently drinks beer, wine and hard liquor only occasionally per week Living situation live with spouse Not under pain contract Number of flights of stairs before winded 2-3 No history of drug/alcohol rehab Marital status married  Medication History Simvastatin (40MG Tablet, Oral) Active. Zocor (40MG Tablet, Oral) Active. Vitamin D (Oral) Specific dose unknown - Active. Magnesium Gluconate (Oral) Specific dose unknown - Active. (every other day) Aspirin EC (81MG Tablet DR, Oral) Active. Fish Oil Burp-Less (1000MG Capsule, Oral) Active. Multivitamins (Oral) Active. Humira Pen (Subcutaneous) Specific dose unknown - Active.  Past Surgical History  Vasectomy Total Knee Replacement - Left  Review of Systems  General Not Present- Chills, Fatigue, Fever, Memory Loss, Night Sweats, Weight Gain and Weight Loss. Skin Not Present- Eczema, Hives, Itching, Lesions and Rash. HEENT Not Present- Dentures, Double Vision, Headache, Hearing Loss, Tinnitus and Visual Loss. Respiratory Not Present- Allergies, Chronic Cough, Coughing up blood, Shortness of   breath at rest and Shortness of breath with  exertion. Cardiovascular Not Present- Chest Pain, Difficulty Breathing Lying Down, Murmur, Palpitations, Racing/skipping heartbeats and Swelling. Gastrointestinal Not Present- Abdominal Pain, Bloody Stool, Constipation, Diarrhea, Difficulty Swallowing, Heartburn, Jaundice, Loss of appetitie, Nausea and Vomiting. Male Genitourinary Not Present- Blood in Urine, Discharge, Flank Pain, Incontinence, Painful Urination, Urgency, Urinary frequency, Urinary Retention, Urinating at Night and Weak urinary stream. Musculoskeletal Present- Joint Pain. Not Present- Back Pain, Joint Swelling, Morning Stiffness, Muscle Pain, Muscle Weakness and Spasms. Neurological Not Present- Blackout spells, Difficulty with balance, Dizziness, Paralysis, Tremor and Weakness. Psychiatric Not Present- Insomnia.  Vitals  Weight: 243 lb Height: 72in Body Surface Area: 2.31 m Body Mass Index: 32.96 kg/m  BP: 124/64 (Sitting, Left Arm, Standard)   Physical Exam General Mental Status -Alert, cooperative and good historian. General Appearance-pleasant, Not in acute distress. Orientation-Oriented X3. Build & Nutrition-Well nourished and Well developed.  Head and Neck Head-normocephalic, atraumatic . Neck Global Assessment - supple, no bruit auscultated on the right, no bruit auscultated on the left.  Eye Vision-Wears corrective lenses. Pupil - Bilateral-Regular and Round. Motion - Bilateral-EOMI.  ENMT Note: bilateral hearing aids   Chest and Lung Exam Auscultation Breath sounds - clear at anterior chest wall and clear at posterior chest wall. Adventitious sounds - No Adventitious sounds.  Cardiovascular Auscultation Rhythm - Regular rate and rhythm. Heart Sounds - S1 WNL and S2 WNL. Murmurs & Other Heart Sounds - Auscultation of the heart reveals - No Murmurs.  Abdomen Palpation/Percussion Tenderness - Abdomen is non-tender to palpation. Rigidity (guarding) - Abdomen is  soft. Auscultation Auscultation of the abdomen reveals - Bowel sounds normal.  Male Genitourinary Note: Not done, not pertinent to present illness   Musculoskeletal Note: On exam, he is alert and oriented and in no apparent distress. His right knee shows no swelling. There is slight varus range 5 to 120 degrees. He has moderate crepitus on range of motion, and tender medial greater than lateral with no instability. The left knee looks great. There is no swelling. Range of motion is 0 to 122 degrees. There is no tenderness or instability noted.  RADIOGRAPHS: His radiographs, AP of both knees and lateral show the prosthesis on the left is in excellent position with no periprosthetic abnormalities. On the right, he has signifcant medial and patellofemoral bone on bone with a varus deformity present.  Assessment & Plan Status post total left knee replacement (Z96.652) Primary osteoarthritis of right knee (M17.11) Note:Surgical Plans: Right Total Knee Replacement  Disposition: [facility]  PCP: Dr. James Kim - Patient has been seen preoperatively and felt to be stable for surgery.  IV TXA  Anesthesia Issues: None  Signed electronically by Kadee Philyaw L Michaline Kindig, III PA-C 

## 2014-10-31 NOTE — Transfer of Care (Signed)
Immediate Anesthesia Transfer of Care Note  Patient: [name]  Procedure(s) Performed: Procedure(s): RIGHT TOTAL KNEE ARTHROPLASTY (Right)  Patient Location: PACU  Anesthesia Type:Regional  Level of Consciousness: awake, alert  and oriented  Airway & Oxygen Therapy: Patient Spontanous Breathing and Patient connected to face mask oxygen  Post-op Assessment: Report given to RN and Post -op Vital signs reviewed and stable  Post vital signs: Reviewed and stable  Last Vitals:  Filed Vitals:   10/31/14 0850  BP: 144/71  Pulse: 76  Temp: 36.6 C  Resp: 18    Complications: No apparent anesthesia complications

## 2014-10-31 NOTE — Anesthesia Postprocedure Evaluation (Signed)
  Anesthesia Post-op Note  Patient: [name]  Procedure(s) Performed: Procedure(s) (LRB): RIGHT TOTAL KNEE ARTHROPLASTY (Right)  Patient Location: PACU  Anesthesia Type: Spinal  Level of Consciousness: awake and alert   Airway and Oxygen Therapy: Patient Spontanous Breathing  Post-op Pain: mild  Post-op Assessment: Post-op Vital signs reviewed, Patient's Cardiovascular Status Stable, Respiratory Function Stable, Patent Airway and No signs of Nausea or vomiting  Last Vitals:  Filed Vitals:   10/31/14 1430  BP: 121/73  Pulse: 56  Temp:   Resp: 18    Post-op Vital Signs: stable   Complications: No apparent anesthesia complications

## 2014-10-31 NOTE — Op Note (Signed)
Pre-operative diagnosis- Osteoarthritis  Right knee(s)  Post-operative diagnosis- Osteoarthritis Right knee(s)  Procedure-  Right  Total Knee Arthroplasty  Surgeon- [name]. Victor Schoeller, MD  Assistant- Victor Ped, PA-C   Anesthesia-  Spinal  EBL-* No blood loss amount entered *   Drains Hemovac  Tourniquet time-  Total Tourniquet Time Documented: Thigh (Right) - 37 minutes Total: Thigh (Right) - 37 minutes     Complications- None  Condition-PACU - hemodynamically stable.   Brief Clinical Note  [name] is a 81 y.o. year old male with end stage OA of his right knee with progressively worsening pain and dysfunction. He has constant pain, with activity and at rest and significant functional deficits with difficulties even with ADLs. He has had extensive non-op management including analgesics, injections of cortisone and viscosupplements, and home exercise program, but remains in significant pain with significant dysfunction. Radiographs show bone on bone arthritis medial and patellofemoral. He presents now for right Total Knee Arthroplasty.    Procedure in detail---   The patient is brought into the operating room and positioned supine on the operating table. After successful administration of  Spinal,   a tourniquet is placed high on the  Right thigh(s) and the lower extremity is prepped and draped in the usual sterile fashion. Time out is performed by the operating team and then the  Right lower extremity is wrapped in Esmarch, knee flexed and the tourniquet inflated to 300 mmHg.       A midline incision is made with a ten blade through the subcutaneous tissue to the level of the extensor mechanism. A fresh blade is used to make a medial parapatellar arthrotomy. Soft tissue over the proximal medial tibia is subperiosteally elevated to the joint line with a knife and into the semimembranosus bursa with a Cobb elevator. Soft tissue over the proximal lateral tibia is elevated with  attention being paid to avoiding the patellar tendon on the tibial tubercle. The patella is everted, knee flexed 90 degrees and the ACL and PCL are removed. Findings are bone on bone medial and patellofemoral with large global osteophytes.        The drill is used to create a starting hole in the distal femur and the canal is thoroughly irrigated with sterile saline to remove the fatty contents. The 5 degree Right  valgus alignment guide is placed into the femoral canal and the distal femoral cutting block is pinned to remove 11 mm off the distal femur. Resection is made with an oscillating saw.      The tibia is subluxed forward and the menisci are removed. The extramedullary alignment guide is placed referencing proximally at the medial aspect of the tibial tubercle and distally along the second metatarsal axis and tibial crest. The block is pinned to remove 2mm off the more deficient medial  side. Resection is made with an oscillating saw. Size 5is the most appropriate size for the tibia and the proximal tibia is prepared with the modular drill and keel punch for that size.      The femoral sizing guide is placed and size 5 is most appropriate. Rotation is marked off the epicondylar axis and confirmed by creating a rectangular flexion gap at 90 degrees. The size 5 cutting block is pinned in this rotation and the anterior, posterior and chamfer cuts are made with the oscillating saw. The intercondylar block is then placed and that cut is made.      Trial size 5 tibial component,  trial size 5 posterior stabilized femur and a 12.5  mm posterior stabilized rotating platform insert trial is placed. Full extension is achieved with excellent varus/valgus and anterior/posterior balance throughout full range of motion. The patella is everted and thickness measured to be 27  mm. Free hand resection is taken to 15 mm, a 41 template is placed, lug holes are drilled, trial patella is placed, and it tracks normally.  Osteophytes are removed off the posterior femur with the trial in place. All trials are removed and the cut bone surfaces prepared with pulsatile lavage. Cement is mixed and once ready for implantation, the size 5 tibial implant, size  5 posterior stabilized femoral component, and the size 41 patella are cemented in place and the patella is held with the clamp. The trial insert is placed and the knee held in full extension. The Exparel (20 ml mixed with 30 ml saline) and .25% Bupivicaine, are injected into the extensor mechanism, posterior capsule, medial and lateral gutters and subcutaneous tissues.  All extruded cement is removed and once the cement is hard the permanent 12.5 mm posterior stabilized rotating platform insert is placed into the tibial tray.      The wound is copiously irrigated with saline solution and the extensor mechanism closed over a hemovac drain with #1 V-loc suture. The tourniquet is released for a total tourniquet time of 37  minutes. Flexion against gravity is 140 degrees and the patella tracks normally. Subcutaneous tissue is closed with 2.0 vicryl and subcuticular with running 4.0 Monocryl. The incision is cleaned and dried and steri-strips and a bulky sterile dressing are applied. The limb is placed into a knee immobilizer and the patient is awakened and transported to recovery in stable condition.      Please note that a surgical assistant was a medical necessity for this procedure in order to perform it in a safe and expeditious manner. Surgical assistant was necessary to retract the ligaments and vital neurovascular structures to prevent injury to them and also necessary for proper positioning of the limb to allow for anatomic placement of the prosthesis.   Victor RankinFrank V. Jaleea Alesi, MD    10/31/2014, 12:19 PM

## 2014-10-31 NOTE — Anesthesia Procedure Notes (Signed)
Spinal Patient location during procedure: OR End time: 10/31/2014 11:18 AM Staffing Resident/CRNA: Noralyn Pick D Performed by: resident/CRNA  Preanesthetic Checklist Completed: patient identified, site marked, surgical consent, pre-op evaluation, timeout performed, IV checked, risks and benefits discussed and monitors and equipment checked Spinal Block Patient position: sitting Prep: Betadine Patient monitoring: heart rate, continuous pulse ox and blood pressure Approach: midline Location: L2-3 Injection technique: single-shot Needle Needle type: Sprotte and Pencil-Tip  Needle gauge: 24 G Needle length: 9 cm Assessment Sensory level: T6 Additional Notes Expiration date of kit checked and confirmed. Patient tolerated procedure well, without complications.

## 2014-10-31 NOTE — Interval H&P Note (Signed)
History and Physical Interval Note:  10/31/2014 9:24 AM  [name]  has presented today for surgery, with the diagnosis of OA RIGHT KNEE  The various methods of treatment have been discussed with the patient and family. After consideration of risks, benefits and other options for treatment, the patient has consented to  Procedure(s): RIGHT TOTAL KNEE ARTHROPLASTY (Right) as a surgical intervention .  The patient's history has been reviewed, patient examined, no change in status, stable for surgery.  I have reviewed the patient's chart and labs.  Questions were answered to the patient's satisfaction.     [name],[name]

## 2014-11-01 ENCOUNTER — Encounter (HOSPITAL_COMMUNITY): Payer: Self-pay | Admitting: Orthopedic Surgery

## 2014-11-01 LAB — BASIC METABOLIC PANEL
Anion gap: 11 (ref 5–15)
BUN: 17 mg/dL (ref 6–20)
CO2: 25 mmol/L (ref 22–32)
Calcium: 8.9 mg/dL (ref 8.9–10.3)
Chloride: 104 mmol/L (ref 101–111)
Creatinine, Ser: 0.95 mg/dL (ref 0.61–1.24)
GFR calc Af Amer: >60 mL/min (ref >60)
Glucose, Bld: 147 mg/dL — ABNORMAL HIGH (ref 65–99)
Potassium: 4.5 mmol/L (ref 3.5–5.1)
Sodium: 140 mmol/L (ref 135–145)

## 2014-11-01 LAB — CBC
HCT: 38.4 % — ABNORMAL LOW (ref 39.0–52.0)
HEMOGLOBIN: 12.6 g/dL — AB (ref 13.0–17.0)
MCH: 30.9 pg (ref 26.0–34.0)
MCHC: 32.8 g/dL (ref 30.0–36.0)
MCV: 94.1 fL (ref 78.0–100.0)
Platelets: 177 10*3/uL (ref 150–400)
RBC: 4.08 MIL/uL — ABNORMAL LOW (ref 4.22–5.81)
RDW: 13 % (ref 11.5–15.5)
WBC: 16.2 10*3/uL — AB (ref 4.0–10.5)

## 2014-11-01 MED ORDER — RIVAROXABAN 10 MG PO TABS: 10.0000 mg | ORAL_TABLET | Freq: Every day | ORAL | Status: DC

## 2014-11-01 MED ORDER — HYDROMORPHONE HCL 2 MG PO TABS: 2.0000 mg | ORAL_TABLET | ORAL | Status: DC | PRN

## 2014-11-01 MED ORDER — TRAMADOL HCL 50 MG PO TABS: 50.0000 mg | ORAL_TABLET | Freq: Four times a day (QID) | ORAL | Status: DC | PRN

## 2014-11-01 MED ORDER — METHOCARBAMOL 500 MG PO TABS: 500.0000 mg | ORAL_TABLET | Freq: Four times a day (QID) | ORAL | Status: DC | PRN

## 2014-11-01 NOTE — Care Management Note (Signed)
Case Management Note  Patient Details  Name: [name] MRN: [id number] Date of Birth: Oct 04, 1927  Subjective/Objective:                   RIGHT TOTAL KNEE ARTHROPLASTY (Right) Action/Plan:  Discharge planning Expected Discharge Date:  11/02/14               Expected Discharge Plan:  Skilled Nursing Facility  In-House Referral:     Discharge planning Services  CM Consult  Post Acute Care Choice:    Choice offered to:     DME Arranged:    DME Agency:     HH Arranged:    HH Agency:     Status of Service:  Completed, signed off  Medicare Important Message Given:    Date Medicare IM Given:    Medicare IM give by:    Date Additional Medicare IM Given:    Additional Medicare Important Message give by:     If discussed at Long Length of Stay Meetings, dates discussed:    Additional Comments: CM notes pt to go to SNF for rehab; CSW arranging [facility].  No other CM needs were communicated. Yves DillJeffries, Mohab Ashby Christine, RN 11/01/2014, 12:00 PM

## 2014-11-01 NOTE — Discharge Summary (Signed)
Physician Discharge Summary   Patient ID: [name] MRN: [id number] DOB/AGE: 79-Jul-1929 79 y.o.  Admit date: 10/31/2014 Discharge date: 11-02-2014  Primary Diagnosis:  Osteoarthritis Right knee(s)  Admission Diagnoses:  Past Medical History  Diagnosis Date  . Arthritis     STATES HIS SHOULDERS ARE "FROZEN" - VERY LIMITIED RANGE OF MOTION; OA BOTH KNEES  . Hyperlipidemia   . Psoriasis   . Coronary artery disease     "MILD NONCRITICAL CORONARY ARTERY DISEASE " - PER OFFICE NOTE DR. Einar Gip 02/27/12 - PT NO LONGER HAS TO SEE CARDIOLGIST UNLESS HE HAS A NEED.  Marland Kitchen Hx: UTI (urinary tract infection)    Discharge Diagnoses:   Principal Problem:   OA (osteoarthritis) of knee  Estimated body mass index is 34.03 kg/(m^2) as calculated from the following:   Height as of this encounter: 6' (1.829 m).   Weight as of this encounter: 113.853 kg (251 lb).  Procedure:  Procedure(s) (LRB): RIGHT TOTAL KNEE ARTHROPLASTY (Right)   Consults: None  HPI: [name] is a 81 y.o. year old male with end stage OA of his right knee with progressively worsening pain and dysfunction. He has constant pain, with activity and at rest and significant functional deficits with difficulties even with ADLs. He has had extensive non-op management including analgesics, injections of cortisone and viscosupplements, and home exercise program, but remains in significant pain with significant dysfunction. Radiographs show bone on bone arthritis medial and patellofemoral. He presents now for right Total Knee Arthroplasty.  Laboratory Data: Admission on 10/31/2014  Component Date Value Ref Range Status  . WBC 11/01/2014 16.2* 4.0 - 10.5 K/uL Final  . RBC 11/01/2014 4.08* 4.22 - 5.81 MIL/uL Final  . Hemoglobin 11/01/2014 12.6* 13.0 - 17.0 g/dL Final  . HCT 11/01/2014 38.4* 39.0 - 52.0 % Final  . MCV 11/01/2014 94.1  78.0 - 100.0 fL Final  . MCH 11/01/2014 30.9  26.0 - 34.0 pg Final  . MCHC 11/01/2014 32.8  30.0 -  36.0 g/dL Final  . RDW 11/01/2014 13.0  11.5 - 15.5 % Final  . Platelets 11/01/2014 177  150 - 400 K/uL Final  . Sodium 11/01/2014 140  135 - 145 mmol/L Final  . Potassium 11/01/2014 4.5  3.5 - 5.1 mmol/L Final  . Chloride 11/01/2014 104  101 - 111 mmol/L Final  . CO2 11/01/2014 25  22 - 32 mmol/L Final  . Glucose, Bld 11/01/2014 147* 65 - 99 mg/dL Final  . BUN 11/01/2014 17  6 - 20 mg/dL Final  . Creatinine, Ser 11/01/2014 0.95  0.61 - 1.24 mg/dL Final  . Calcium 11/01/2014 8.9  8.9 - 10.3 mg/dL Final  . GFR calc non Af Amer 11/01/2014 >60  >60 mL/min Final  . GFR calc Af Amer 11/01/2014 >60  >60 mL/min Final   Comment: (NOTE) The eGFR has been calculated using the CKD EPI equation. This calculation has not been validated in all clinical situations. eGFR's persistently <60 mL/min signify possible Chronic Kidney Disease.   Georgiann Hahn gap 11/01/2014 11  5 - 15 Final  Hospital Outpatient Visit on 10/26/2014  Component Date Value Ref Range Status  . aPTT 10/26/2014 33  24 - 37 seconds Final  . WBC 10/26/2014 5.4  4.0 - 10.5 K/uL Final  . RBC 10/26/2014 4.40  4.22 - 5.81 MIL/uL Final  . Hemoglobin 10/26/2014 13.8  13.0 - 17.0 g/dL Final  . HCT 10/26/2014 42.3  39.0 - 52.0 % Final  . MCV 10/26/2014  96.1  78.0 - 100.0 fL Final  . MCH 10/26/2014 31.4  26.0 - 34.0 pg Final  . MCHC 10/26/2014 32.6  30.0 - 36.0 g/dL Final  . RDW 10/26/2014 13.5  11.5 - 15.5 % Final  . Platelets 10/26/2014 195  150 - 400 K/uL Final  . Sodium 10/26/2014 144  135 - 145 mmol/L Final  . Potassium 10/26/2014 5.2* 3.5 - 5.1 mmol/L Final   NO VISIBLE HEMOLYSIS  . Chloride 10/26/2014 108  101 - 111 mmol/L Final  . CO2 10/26/2014 27  22 - 32 mmol/L Final  . Glucose, Bld 10/26/2014 99  65 - 99 mg/dL Final  . BUN 10/26/2014 16  6 - 20 mg/dL Final  . Creatinine, Ser 10/26/2014 0.95  0.61 - 1.24 mg/dL Final  . Calcium 10/26/2014 9.4  8.9 - 10.3 mg/dL Final  . Total Protein 10/26/2014 7.7  6.5 - 8.1 g/dL Final  .  Albumin 10/26/2014 4.2  3.5 - 5.0 g/dL Final  . AST 10/26/2014 24  15 - 41 U/L Final  . ALT 10/26/2014 25  17 - 63 U/L Final  . Alkaline Phosphatase 10/26/2014 61  38 - 126 U/L Final  . Total Bilirubin 10/26/2014 0.8  0.3 - 1.2 mg/dL Final  . GFR calc non Af Amer 10/26/2014 >60  >60 mL/min Final  . GFR calc Af Amer 10/26/2014 >60  >60 mL/min Final   Comment: (NOTE) The eGFR has been calculated using the CKD EPI equation. This calculation has not been validated in all clinical situations. eGFR's persistently <60 mL/min signify possible Chronic Kidney Disease.   . Anion gap 10/26/2014 9  5 - 15 Final  . Prothrombin Time 10/26/2014 14.3  11.6 - 15.2 seconds Final  . INR 10/26/2014 1.09  0.00 - 1.49 Final  . Color, Urine 10/26/2014 YELLOW  YELLOW Final  . APPearance 10/26/2014 CLEAR  CLEAR Final  . Specific Gravity, Urine 10/26/2014 1.019  1.005 - 1.030 Final  . pH 10/26/2014 7.0  5.0 - 8.0 Final  . Glucose, UA 10/26/2014 NEGATIVE  NEGATIVE mg/dL Final  . Hgb urine dipstick 10/26/2014 NEGATIVE  NEGATIVE Final  . Bilirubin Urine 10/26/2014 NEGATIVE  NEGATIVE Final  . Ketones, ur 10/26/2014 NEGATIVE  NEGATIVE mg/dL Final  . Protein, ur 10/26/2014 NEGATIVE  NEGATIVE mg/dL Final  . Urobilinogen, UA 10/26/2014 0.2  0.0 - 1.0 mg/dL Final  . Nitrite 10/26/2014 NEGATIVE  NEGATIVE Final  . Leukocytes, UA 10/26/2014 NEGATIVE  NEGATIVE Final   MICROSCOPIC NOT DONE ON URINES WITH NEGATIVE PROTEIN, BLOOD, LEUKOCYTES, NITRITE, OR GLUCOSE <1000 mg/dL.  Marland Kitchen MRSA, PCR 10/26/2014 NEGATIVE  NEGATIVE Final  . Staphylococcus aureus 10/26/2014 NEGATIVE  NEGATIVE Final   Comment:        The Xpert SA Assay (FDA approved for NASAL specimens in patients over 44 years of age), is one component of a comprehensive surveillance program.  Test performance has been validated by [facility] for patients greater than or equal to 88 year old. It is not intended to diagnose infection nor to guide or monitor  treatment.   . ABO/RH(D) 10/26/2014 O POS   Final  . Antibody Screen 10/26/2014 NEG   Final  . Sample Expiration 10/26/2014 11/03/2014   Final     X-Rays:No results found.  EKG: Orders placed or performed during the hospital encounter of 10/26/14  . EKG 12-Lead  . EKG 12-Lead     Hospital Course: KAELIN BONELLI is a 81 y.o. who was admitted to [facility]. They were brought to the operating room on 10/31/2014 and underwent Procedure(s): RIGHT TOTAL KNEE ARTHROPLASTY.  Patient tolerated the procedure well and was later transferred to the recovery room and then to the orthopaedic floor for postoperative care.  They were given PO and IV analgesics for pain control following their surgery.  They were given 24 hours of postoperative antibiotics of  Anti-infectives    Start     Dose/Rate Route Frequency Ordered Stop   10/31/14 1800  ceFAZolin (ANCEF) IVPB 2 g/50 mL premix     2 g 100 mL/hr over 30 Minutes Intravenous Every 6 hours 10/31/14 1509 10/31/14 2357   10/31/14 1015  ceFAZolin (ANCEF) IVPB 2 g/50 mL premix     2 g 100 mL/hr over 30 Minutes Intravenous On call to O.R. 10/31/14 1015 10/31/14 1120     and started on DVT prophylaxis in the form of Xarelto.   PT and OT were ordered for total joint protocol.  Discharge planning consulted to help with postop disposition and equipment needs. Social worker got invovled to assist with placement of the patient into the SNF at [facility].  Patient had a decent night on the evening of surgery.  They started to get up OOB with therapy on day one. Hemovac drain was pulled without difficulty.  Continued to work with therapy into day two.  Dressing was changed on day two and the incision was healing well. Patient was seen in rounds and was ready to go to [facility] later that same day on POD 2.  Discharge to SNF Diet - Cardiac diet Follow up - in 2 weeks Activity - WBAT Disposition - Skilled nursing facility Condition Upon Discharge -  Good D/C Meds - See DC Summary DVT Prophylaxis - Xarelto  Discharge Instructions    Call MD / Call 911    Complete by:  As directed   If you experience chest pain or shortness of breath, CALL 911 and be transported to the hospital emergency room.  If you develope a fever above 101 F, pus (white drainage) or increased drainage or redness at the wound, or calf pain, call your surgeon's office.     Change dressing    Complete by:  As directed   Change dressing daily with sterile 4 x 4 inch gauze dressing and apply TED hose. Do not submerge the incision under water.     Constipation Prevention    Complete by:  As directed   Drink plenty of fluids.  Prune juice may be helpful.  You may use a stool softener, such as Colace (over the counter) 100 mg twice a day.  Use MiraLax (over the counter) for constipation as needed.     Diet - low sodium heart healthy    Complete by:  As directed      Discharge instructions    Complete by:  As directed   Pick up stool softner and laxative for home use following surgery while on pain medications. Do not submerge incision under water. Please use good hand washing techniques while changing dressing each day. May shower starting three days after surgery. Please use a clean towel to pat the incision dry following showers. Continue to use ice for pain and swelling after surgery. Do not use any lotions or creams on the incision until instructed by your surgeon.  Take Xarelto for two and a half more weeks, then discontinue Xarelto. Once the patient has completed the blood thinner regimen, then take a Baby 81  mg Aspirin daily for three more weeks.  Postoperative Constipation Protocol  Constipation - defined medically as fewer than three stools per week and severe constipation as less than one stool per week.  One of the most common issues patients have following surgery is constipation.  Even if you have a regular bowel pattern at home, your normal regimen is  likely to be disrupted due to multiple reasons following surgery.  Combination of anesthesia, postoperative narcotics, change in appetite and fluid intake all can affect your bowels.  In order to avoid complications following surgery, here are some recommendations in order to help you during your recovery period.  Colace (docusate) - Pick up an over-the-counter form of Colace or another stool softener and take twice a day as long as you are requiring postoperative pain medications.  Take with a full glass of water daily.  If you experience loose stools or diarrhea, hold the colace until you stool forms back up.  If your symptoms do not get better within 1 week or if they get worse, check with your doctor.  Dulcolax (bisacodyl) - Pick up over-the-counter and take as directed by the product packaging as needed to assist with the movement of your bowels.  Take with a full glass of water.  Use this product as needed if not relieved by Colace only.   MiraLax (polyethylene glycol) - Pick up over-the-counter to have on hand.  MiraLax is a solution that will increase the amount of water in your bowels to assist with bowel movements.  Take as directed and can mix with a glass of water, juice, soda, coffee, or tea.  Take if you go more than two days without a movement. Do not use MiraLax more than once per day. Call your doctor if you are still constipated or irregular after using this medication for 7 days in a row.  If you continue to have problems with postoperative constipation, please contact the office for further assistance and recommendations.  If you experience "the worst abdominal pain ever" or develop nausea or vomiting, please contact the office immediatly for further recommendations for treatment.  When discharged from the skilled rehab facility, please have the facility set up the patient's [place] prior to being released.  Please make sure this gets set up prior to release in  order to avoid any lapse of therapy following the rehab stay.  Also provide the patient with their medications at time of release from the facility to include their pain medication, the muscle relaxants, and their blood thinner medication.  If the patient is still at the rehab facility at time of follow up appointment, please also assist the patient in arranging follow up appointment in our office and any transportation needs. ICE to the affected knee or hip every three hours for 30 minutes at a time and then as needed for pain and swelling.     Do not put a pillow under the knee. Place it under the heel.    Complete by:  As directed      Do not sit on low chairs, stoools or toilet seats, as it may be difficult to get up from low surfaces    Complete by:  As directed      Driving restrictions    Complete by:  As directed   No driving until released by the physician.     Increase activity slowly as tolerated    Complete by:  As directed  Lifting restrictions    Complete by:  As directed   No lifting until released by the physician.     Patient may shower    Complete by:  As directed   You may shower without a dressing once there is no drainage.  Do not wash over the wound.  If drainage remains, do not shower until drainage stops.     TED hose    Complete by:  As directed   Use stockings (TED hose) for 3 weeks on both leg(s).  You may remove them at night for sleeping.     Weight bearing as tolerated    Complete by:  As directed   Laterality:  right  Extremity:  Lower            Medication List    STOP taking these medications        Adalimumab 20 MG/0.4ML Pskt     aspirin EC 81 MG tablet     CO Q 10 PO     FISH OIL PO     oxyCODONE 5 MG immediate release tablet  Commonly known as:  Oxy IR/ROXICODONE      TAKE these medications        acetaminophen 325 MG tablet  Commonly known as:  TYLENOL  Take 2 tablets (650 mg total) by mouth every 6 (six) hours as needed for  mild pain (or Fever >/= 101).     bisacodyl 10 MG suppository  Commonly known as:  DULCOLAX  Place 1 suppository (10 mg total) rectally daily as needed for mild constipation or moderate constipation.     DSS 100 MG Caps  Take 100 mg by mouth 2 (two) times daily.     HYDROmorphone 2 MG tablet  Commonly known as:  DILAUDID  Take 1-2 tablets (2-4 mg total) by mouth every 4 (four) hours as needed for moderate pain or severe pain.     magnesium oxide 400 MG tablet  Commonly known as:  MAG-OX  Take 400 mg by mouth every evening.     methocarbamol 500 MG tablet  Commonly known as:  ROBAXIN  Take 1 tablet (500 mg total) by mouth every 6 (six) hours as needed for muscle spasms.     metoCLOPramide 5 MG tablet  Commonly known as:  REGLAN  Take 1-2 tablets (5-10 mg total) by mouth every 8 (eight) hours as needed for nausea (if ondansetron (ZOFRAN) ineffective.).     ondansetron 4 MG tablet  Commonly known as:  ZOFRAN  Take 1 tablet (4 mg total) by mouth every 6 (six) hours as needed for nausea.     polyethylene glycol packet  Commonly known as:  MIRALAX / GLYCOLAX  Take 17 g by mouth daily as needed for mild constipation.     rivaroxaban 10 MG Tabs tablet  Commonly known as:  XARELTO  - Take 1 tablet (10 mg total) by mouth daily with breakfast. Take Xarelto for two and a half more weeks, then discontinue Xarelto.  - Once the patient has completed the Xarelto, they may resume the 81 mg Aspirin.     simvastatin 80 MG tablet  Commonly known as:  ZOCOR  Take 40 mg by mouth every evening.     traMADol 50 MG tablet  Commonly known as:  ULTRAM  Take 1-2 tablets (50-100 mg total) by mouth every 6 (six) hours as needed (mild to moderate pain).     triamcinolone cream 0.1 %  Commonly known as:  KENALOG  Apply 1 application topically 2 (two) times daily.           Follow-up Information    Follow up with Gearlean Alf, MD. Schedule an appointment as soon as possible for a visit on  11/15/2014.   Specialty:  Orthopedic Surgery   Why:  Call office at [phone] to setup appointment with Dr. Wynelle Link on Tuesday 11/15/2014.   Contact information:   [address] [place] [address] [phone]       Signed: Arlee Muslim, PA-C Orthopaedic Surgery 11/01/2014, 9:18 PM

## 2014-11-01 NOTE — Progress Notes (Signed)
Physical Therapy Treatment Patient Details Name: [name] MRN: [id number] DOB: 09/14/1927 Today's Date: 11/01/2014    History of Present Illness s/p R TKA    PT Comments      Follow Up Recommendations  SNF     Equipment Recommendations  None recommended by PT    Recommendations for Other Services OT consult     Precautions / Restrictions Precautions Precautions: Knee Required Braces or Orthoses: Knee Immobilizer - Right Knee Immobilizer - Right: Discontinue once straight leg raise with < 10 degree lag Restrictions Weight Bearing Restrictions: No Other Position/Activity Restrictions: WBAT    Mobility  Bed Mobility Overal bed mobility: Needs Assistance Bed Mobility: Supine to Sit;Sit to Supine     Supine to sit: Min assist Sit to supine: Min assist   General bed mobility comments: cues for sequence and use of L LE to self assist  Transfers Overall transfer level: Needs assistance Equipment used: Rolling walker (2 wheeled) Transfers: Sit to/from Stand Sit to Stand: Min assist         General transfer comment: cues for UE/LE placement  Ambulation/Gait Ambulation/Gait assistance: Min assist Ambulation Distance (Feet): 62 Feet Assistive device: Rolling walker (2 wheeled) Gait Pattern/deviations: Step-to pattern;Decreased step length - right;Decreased step length - left;Shuffle;Trunk flexed     General Gait Details: cues for sequence, posture and position from [employer]            Wheelchair Mobility    Modified Rankin (Stroke Patients Only)       Balance                                    Cognition Arousal/Alertness: Awake/alert Behavior During Therapy: WFL for tasks assessed/performed Overall Cognitive Status: Within Functional Limits for tasks assessed                      Exercises Total Joint Exercises Ankle Circles/Pumps: AROM;Both;15 reps;Supine Quad Sets: AROM;10 reps;Both;Supine Heel Slides:  AAROM;Right;15 reps;Supine Straight Leg Raises: AAROM;Right;10 reps;Supine    General Comments        Pertinent Vitals/Pain Pain Assessment: 0-10 Pain Score: 4  Pain Location: R knee Pain Descriptors / Indicators: Aching;Sore Pain Intervention(s): Limited activity within patient's tolerance;Monitored during session;Premedicated before session;Ice applied    Home Living                      Prior Function            PT Goals (current goals can now be found in the care plan section) Acute Rehab PT Goals Patient Stated Goal: get back to being independent PT Goal Formulation: With patient Time For Goal Achievement: 11/08/14 Potential to Achieve Goals: Good Progress towards PT goals: Progressing toward goals    Frequency  7X/week    PT Plan Current plan remains appropriate    Co-evaluation             End of Session Equipment Utilized During Treatment: Gait belt;Right knee immobilizer Activity Tolerance: Patient tolerated treatment well Patient left: in bed;with call bell/phone within reach;with family/visitor present     Time: 4098-1191 PT Time Calculation (min) (ACUTE ONLY): 28 min  Charges:  $Gait Training: 8-22 mins $Therapeutic Exercise: 8-22 mins                    G Codes:      Brando Taves  11/01/2014, 4:40 PM

## 2014-11-01 NOTE — Plan of Care (Signed)
Problem: Consults Goal: Diagnosis- Total Joint Replacement Outcome: Completed/Met Date Met:  11/01/14 Primary Total Knee RIGHT  Problem: Phase II Progression Outcomes Goal: Discharge plan established Outcome: Completed/Met Date Met:  11/01/14 [employer]

## 2014-11-01 NOTE — Discharge Instructions (Addendum)
° °Dr. Frank Aluisio °Total Joint Specialist °[facility] °[address] °[place], [place] [address] °[phone] ° °TOTAL KNEE REPLACEMENT POSTOPERATIVE DIRECTIONS ° °Knee Rehabilitation, Guidelines Following Surgery  °Results after knee surgery are often greatly improved when you follow the exercise, range of motion and muscle strengthening exercises prescribed by your doctor. Safety measures are also important to protect the knee from further injury. Any time any of these exercises cause you to have increased pain or swelling in your knee joint, decrease the amount until you are comfortable again and slowly increase them. If you have problems or questions, call your caregiver or physical therapist for advice.  ° °HOME CARE INSTRUCTIONS  °Remove items at home which could result in a fall. This includes throw rugs or furniture in walking pathways.  °· ICE to the affected knee every three hours for 30 minutes at a time and then as needed for pain and swelling.  Continue to use ice on the knee for pain and swelling from surgery. You may notice swelling that will progress down to the foot and ankle.  This is normal after surgery.  Elevate the leg when you are not up walking on it.   °· Continue to use the breathing machine which will help keep your temperature down.  It is common for your temperature to cycle up and down following surgery, especially at night when you are not up moving around and exerting yourself.  The breathing machine keeps your lungs expanded and your temperature down. °· Do not place pillow under knee, focus on keeping the knee straight while resting ° °DIET °You may resume your previous home diet once your are discharged from the hospital. ° °DRESSING / WOUND CARE / SHOWERING °You may shower 3 days after surgery, but keep the wounds dry during showering.  You may use an occlusive plastic wrap (Press'n Seal for example), NO SOAKING/SUBMERGING IN THE BATHTUB.  If the  bandage gets wet, change with a clean dry gauze.  If the incision gets wet, pat the wound dry with a clean towel. °You may start showering once you are discharged home but do not submerge the incision under water. Just pat the incision dry and apply a dry gauze dressing on daily. °Change the surgical dressing daily and reapply a dry dressing each time. ° °ACTIVITY °Walk with your walker as instructed. °Use walker as long as suggested by your caregivers. °Avoid periods of inactivity such as sitting longer than an hour when not asleep. This helps prevent blood clots.  °You may resume a sexual relationship in one month or when given the OK by your doctor.  °You may return to work once you are cleared by your doctor.  °Do not drive a car for 6 weeks or until released by you surgeon.  °Do not drive while taking narcotics. ° °WEIGHT BEARING °Weight bearing as tolerated with assist device (walker, cane, etc) as directed, use it as long as suggested by your surgeon or therapist, typically at least 4-6 weeks. ° °POSTOPERATIVE CONSTIPATION PROTOCOL °Constipation - defined medically as fewer than three stools per week and severe constipation as less than one stool per week. ° °One of the most common issues patients have following surgery is constipation.  Even if you have a regular bowel pattern at home, your normal regimen is likely to be disrupted due to multiple reasons following surgery.  Combination of anesthesia, postoperative narcotics, change in appetite and fluid intake all can affect your bowels.    In order to avoid complications following surgery, here are some recommendations in order to help you during your recovery period. ° °Colace (docusate) - Pick up an over-the-counter form of Colace or another stool softener and take twice a day as long as you are requiring postoperative pain medications.  Take with a full glass of water daily.  If you experience loose stools or diarrhea, hold the colace until you stool forms  back up.  If your symptoms do not get better within 1 week or if they get worse, check with your doctor. ° °Dulcolax (bisacodyl) - Pick up over-the-counter and take as directed by the product packaging as needed to assist with the movement of your bowels.  Take with a full glass of water.  Use this product as needed if not relieved by Colace only.  ° °MiraLax (polyethylene glycol) - Pick up over-the-counter to have on hand.  MiraLax is a solution that will increase the amount of water in your bowels to assist with bowel movements.  Take as directed and can mix with a glass of water, juice, soda, coffee, or tea.  Take if you go more than two days without a movement. °Do not use MiraLax more than once per day. Call your doctor if you are still constipated or irregular after using this medication for 7 days in a row. ° °If you continue to have problems with postoperative constipation, please contact the office for further assistance and recommendations.  If you experience "the worst abdominal pain ever" or develop nausea or vomiting, please contact the office immediatly for further recommendations for treatment. ° °ITCHING ° If you experience itching with your medications, try taking only a single pain pill, or even half a pain pill at a time.  You can also use Benadryl over the counter for itching or also to help with sleep.  ° °TED HOSE STOCKINGS °Wear the elastic stockings on both legs for three weeks following surgery during the day but you may remove then at night for sleeping. ° °MEDICATIONS °See your medication summary on the “After Visit Summary” that the nursing staff will review with you prior to discharge.  You may have some home medications which will be placed on hold until you complete the course of blood thinner medication.  It is important for you to complete the blood thinner medication as prescribed by your surgeon.  Continue your approved medications as instructed at time of  discharge. ° °PRECAUTIONS °If you experience chest pain or shortness of breath - call 911 immediately for transfer to the hospital emergency department.  °If you develop a fever greater that 101 F, purulent drainage from wound, increased redness or drainage from wound, foul odor from the wound/dressing, or calf pain - CONTACT YOUR SURGEON.   °                                                °FOLLOW-UP APPOINTMENTS °Make sure you keep all of your appointments after your operation with your surgeon and caregivers. You should call the office at the above phone number and make an appointment for approximately two weeks after the date of your surgery or on the date instructed by your surgeon outlined in the "After Visit Summary". ° ° °RANGE OF MOTION AND STRENGTHENING EXERCISES  °Rehabilitation of the knee is important following a knee injury or   an operation. After just a few days of immobilization, the muscles of the thigh which control the knee become weakened and shrink (atrophy). Knee exercises are designed to build up the tone and strength of the thigh muscles and to improve knee motion. Often times heat used for twenty to thirty minutes before working out will loosen up your tissues and help with improving the range of motion but do not use heat for the first two weeks following surgery. These exercises can be done on a training (exercise) mat, on the floor, on a table or on a bed. Use what ever works the best and is most comfortable for you Knee exercises include:  °Leg Lifts - While your knee is still immobilized in a splint or cast, you can do straight leg raises. Lift the leg to 60 degrees, hold for 3 sec, and slowly lower the leg. Repeat 10-20 times 2-3 times daily. Perform this exercise against resistance later as your knee gets better.  °Quad and Hamstring Sets - Tighten up the muscle on the front of the thigh (Quad) and hold for 5-10 sec. Repeat this 10-20 times hourly. Hamstring sets are done by pushing the  foot backward against an object and holding for 5-10 sec. Repeat as with quad sets.  °· Leg Slides: Lying on your back, slowly slide your foot toward your buttocks, bending your knee up off the floor (only go as far as is comfortable). Then slowly slide your foot back down until your leg is flat on the floor again. °· Angel Wings: Lying on your back spread your legs to the side as far apart as you can without causing discomfort.  °A rehabilitation program following serious knee injuries can speed recovery and prevent re-injury in the future due to weakened muscles. Contact your doctor or a physical therapist for more information on knee rehabilitation.  ° °IF YOU ARE TRANSFERRED TO A SKILLED REHAB FACILITY °If the patient is transferred to a skilled rehab facility following release from the hospital, a list of the current medications will be sent to the facility for the patient to continue.  When discharged from the skilled rehab facility, please have the facility set up the patient's Home Health Physical Therapy prior to being released. Also, the skilled facility will be responsible for providing the patient with their medications at time of release from the facility to include their pain medication, the muscle relaxants, and their blood thinner medication. If the patient is still at the rehab facility at time of the two week follow up appointment, the skilled rehab facility will also need to assist the patient in arranging follow up appointment in our office and any transportation needs. ° °MAKE SURE YOU:  °Understand these instructions.  °Get help right away if you are not doing well or get worse.  ° ° °Pick up stool softner and laxative for home use following surgery while on pain medications. °Do not submerge incision under water. °Please use good hand washing techniques while changing dressing each day. °May shower starting three days after surgery. °Please use a clean towel to pat the incision dry following  showers. °Continue to use ice for pain and swelling after surgery. °Do not use any lotions or creams on the incision until instructed by your surgeon. ° °Take Xarelto for two and a half more weeks, then discontinue Xarelto. °Once the patient has completed the blood thinner regimen, then take a Baby 81 mg Aspirin daily for three more weeks. ° ° ° °  Information on my medicine - XARELTO® (Rivaroxaban) ° °This medication education was reviewed with me or my healthcare representative as part of my discharge preparation.  The pharmacist that spoke with me during my hospital stay was:  Zeigler, Dustin George, RPH ° °Why was Xarelto® prescribed for you? °Xarelto® was prescribed for you to reduce the risk of blood clots forming after orthopedic surgery. The medical term for these abnormal blood clots is venous thromboembolism (VTE). ° °What do you need to know about xarelto® ? °Take your Xarelto® ONCE DAILY at the same time every day. °You may take it either with or without food. ° °If you have difficulty swallowing the tablet whole, you may crush it and mix in applesauce just prior to taking your dose. ° °Take Xarelto® exactly as prescribed by your doctor and DO NOT stop taking Xarelto® without talking to the doctor who prescribed the medication.  Stopping without other VTE prevention medication to take the place of Xarelto® may increase your risk of developing a clot. ° °After discharge, you should have regular check-up appointments with your healthcare provider that is prescribing your Xarelto®.   ° °What do you do if you miss a dose? °If you miss a dose, take it as soon as you remember on the same day then continue your regularly scheduled once daily regimen the next day. Do not take two doses of Xarelto® on the same day.  ° °Important Safety Information °A possible side effect of Xarelto® is bleeding. You should call your healthcare provider right away if you experience any of the following: °? Bleeding from an injury or  your nose that does not stop. °? Unusual colored urine (red or dark brown) or unusual colored stools (red or black). °? Unusual bruising for unknown reasons. °? A serious fall or if you hit your head (even if there is no bleeding). ° °Some medicines may interact with Xarelto® and might increase your risk of bleeding while on Xarelto®. To help avoid this, consult your healthcare provider or pharmacist prior to using any new prescription or non-prescription medications, including herbals, vitamins, non-steroidal anti-inflammatory drugs (NSAIDs) and supplements. ° °This website has more information on Xarelto®: www.xarelto.com. ° ° ° °

## 2014-11-01 NOTE — Progress Notes (Signed)
   Subjective: 1 Day Post-Op Procedure(s) (LRB): RIGHT TOTAL KNEE ARTHROPLASTY (Right) Patient reports pain as mild.   Patient seen in rounds with Dr. Lequita HaltAluisio. Patient is well, but has had some minor complaints of pain in the knee, requiring pain medications We will start therapy today.  Plan is to go Skilled nursing facility, [facility],  after hospital stay.  Objective: Vital signs in last 24 hours: Temp:  [97.3 F (36.3 C)-98.2 F (36.8 C)] 97.7 F (36.5 C) (06/07 0547) Pulse Rate:  [52-76] 75 (06/07 0547) Resp:  [8-18] 16 (06/07 0547) BP: (102-144)/(48-73) 117/54 mmHg (06/07 0547) SpO2:  [92 %-100 %] 92 % (06/07 0547) Weight:  [113.853 kg (251 lb)] 113.853 kg (251 lb) (06/06 0851)  Intake/Output from previous day:  Intake/Output Summary (Last 24 hours) at 11/01/14 0801 Last data filed at 11/01/14 0548  Gross per 24 hour  Intake 3598.83 ml  Output   2070 ml  Net 1528.83 ml    Intake/Output this shift: UOP 625 since around MN  Labs:  Recent Labs  11/01/14 0425  HGB 12.6*    Recent Labs  11/01/14 0425  WBC 16.2*  RBC 4.08*  HCT 38.4*  PLT 177    Recent Labs  11/01/14 0425  NA 140  K 4.5  CL 104  CO2 25  BUN 17  CREATININE 0.95  GLUCOSE 147*  CALCIUM 8.9   No results for input(s): LABPT, INR in the last 72 hours.  EXAM General - Patient is Alert, Appropriate and Oriented Extremity - Neurovascular intact Sensation intact distally Dorsiflexion/Plantar flexion intact Dressing - dressing C/D/I Motor Function - intact, moving foot and toes well on exam.  Hemovac pulled without difficulty.  Past Medical History  Diagnosis Date  . Arthritis     STATES HIS SHOULDERS ARE "FROZEN" - VERY LIMITIED RANGE OF MOTION; OA BOTH KNEES  . Hyperlipidemia   . Psoriasis   . Coronary artery disease     "MILD NONCRITICAL CORONARY ARTERY DISEASE " - PER OFFICE NOTE DR. Jacinto HalimGANJI 02/27/12 - PT NO LONGER HAS TO SEE CARDIOLGIST UNLESS HE HAS A NEED.  Marland Kitchen. Hx: UTI  (urinary tract infection)     Assessment/Plan: 1 Day Post-Op Procedure(s) (LRB): RIGHT TOTAL KNEE ARTHROPLASTY (Right) Principal Problem:   OA (osteoarthritis) of knee  Estimated body mass index is 34.03 kg/(m^2) as calculated from the following:   Height as of this encounter: 6' (1.829 m).   Weight as of this encounter: 113.853 kg (251 lb). Advance diet Up with therapy Plan for discharge tomorrow Discharge to SNF  DVT Prophylaxis - Xarelto Weight-Bearing as tolerated to right leg D/C O2 and Pulse OX and try on Room Air  Avel Peacerew Perkins, PA-C Orthopaedic Surgery 11/01/2014, 8:01 AM

## 2014-11-01 NOTE — Evaluation (Signed)
Physical Therapy Evaluation Patient Details Name: [name] MRN: [id number] DOB: 20-Jun-1927 Today's Date: 11/01/2014   History of Present Illness  s/p R TKA  Clinical Impression  Pt s/p R TKR presents with decreased R LE strength/ROM and post op pain limiting functional mobility.  Pt would benefit from follow up rehab at SNF level to maximize IND and safety prior to return home with ltd assist.    Follow Up Recommendations SNF    Equipment Recommendations  None recommended by PT    Recommendations for Other Services OT consult     Precautions / Restrictions Precautions Precautions: Knee Required Braces or Orthoses: Knee Immobilizer - Right Knee Immobilizer - Right: Discontinue once straight leg raise with < 10 degree lag Restrictions Weight Bearing Restrictions: No Other Position/Activity Restrictions: WBAT      Mobility  Bed Mobility Overal bed mobility: Needs Assistance Bed Mobility: Supine to Sit     Supine to sit: Min assist     General bed mobility comments: OOB  Transfers Overall transfer level: Needs assistance Equipment used: Rolling walker (2 wheeled) Transfers: Sit to/from Stand Sit to Stand: Min assist         General transfer comment: cues for UE/LE placement  Ambulation/Gait Ambulation/Gait assistance: Min assist Ambulation Distance (Feet): 60 Feet Assistive device: Rolling walker (2 wheeled) Gait Pattern/deviations: Step-to pattern;Decreased step length - right;Decreased step length - left;Shuffle;Trunk flexed     General Gait Details: cues for sequence, posture and position from [employer]  Stairs            Wheelchair Mobility    Modified Rankin (Stroke Patients Only)       Balance                                             Pertinent Vitals/Pain Pain Assessment: 0-10 Pain Score: 2  Pain Location: R knee Pain Descriptors / Indicators: Sore Pain Intervention(s): Limited activity within patient's  tolerance;Monitored during session;Premedicated before session;Repositioned;Ice applied    Home Living Family/patient expects to be discharged to:: Skilled nursing facility Living Arrangements: Spouse/significant other                    Prior Function Level of Independence: Independent               Hand Dominance        Extremity/Trunk Assessment   Upper Extremity Assessment: Overall WFL for tasks assessed           Lower Extremity Assessment: RLE deficits/detail RLE Deficits / Details: 3-/5 quads with AAROM at knee -10 - 75    Cervical / Trunk Assessment: Normal  Communication   Communication: No difficulties  Cognition Arousal/Alertness: Awake/alert Behavior During Therapy: WFL for tasks assessed/performed Overall Cognitive Status: Within Functional Limits for tasks assessed                      General Comments      Exercises Total Joint Exercises Ankle Circles/Pumps: AROM;Both;15 reps;Supine Quad Sets: AROM;10 reps;Both;Supine Heel Slides: AAROM;Right;15 reps;Supine Straight Leg Raises: AAROM;Right;10 reps;Supine      Assessment/Plan    PT Assessment Patient needs continued PT services  PT Diagnosis Difficulty walking   PT Problem List Decreased strength;Decreased range of motion;Decreased activity tolerance;Decreased mobility;Decreased knowledge of use of DME;Pain  PT Treatment Interventions DME instruction;Gait training;Stair training;Functional mobility training;Therapeutic  activities;Therapeutic exercise;Patient/family education   PT Goals (Current goals can be found in the Care Plan section) Acute Rehab PT Goals Patient Stated Goal: get back to being independent PT Goal Formulation: With patient Time For Goal Achievement: 11/08/14 Potential to Achieve Goals: Good    Frequency 7X/week   Barriers to discharge        Co-evaluation               End of Session Equipment Utilized During Treatment: Gait belt;Right  knee immobilizer Activity Tolerance: Patient tolerated treatment well Patient left: in chair;with call bell/phone within reach;with family/visitor present Nurse Communication: Mobility status         Time: 1610-9604 PT Time Calculation (min) (ACUTE ONLY): 34 min   Charges:   PT Evaluation $Initial PT Evaluation Tier I: 1 Procedure PT Treatments $Therapeutic Exercise: 8-22 mins   PT G Codes:        Maricsa Sammons 2014/11/27, 12:31 PM

## 2014-11-01 NOTE — Clinical Social Work Note (Signed)
Clinical Social Work Assessment  Patient Details  Name: [name] MRN: [id number] Date of Birth: [birth year]  Date of referral:  11/01/14               Reason for consult:  Facility Placement                Permission sought to share information with:    Permission granted to share information::     Name::        Agency::     Relationship::     Contact Information:     Housing/Transportation Living arrangements for the past 2 months:  Single Family Home Source of Information:  Patient Patient Interpreter Needed:  None Criminal Activity/Legal Involvement Pertinent to Current Situation/Hospitalization:  No - Comment as needed Significant Relationships:  Spouse Lives with:  Spouse Do you feel safe going back to the place where you live?   (SNF placement needed.) Need for family participation in patient care:  No (Coment)  Care giving concerns: Pt's care cannot be managed at home following surgery.   Social Worker assessment / plan: Pt hospitalized on 10/31/14 for pre planned total left knee replacement. Pt has made prior arrangements to have [facility] at [facility]. SNF contacted and confirmed this plan. Pt is aware SNF is out of network and a higher out of pocket cost will result. Pt has [employer] insurance  Which requires prior authorization. Insurance has been contacted and is reviewing clinicals.  Employment status:  Retired Database administratornsurance information:  Managed Medicare PT Recommendations:  Skilled Nursing Facility Information / Referral to community resources:  Skilled Nursing Facility  Patient/Family's Response to care: Pt feels [facility] rehab is needed.  Patient/Family's Understanding of and Emotional Response to Diagnosis, Current Treatment, and Prognosis:  Pt is motivated to work with therapy and is looking forward to rehab at [facility].  Emotional Assessment Appearance:  Appears stated age Attitude/Demeanor/Rapport:  Other (cooperative) Affect (typically  observed):  Pleasant, Appropriate Orientation:    Alcohol / Substance use:  Not Applicable Psych involvement (Current and /or in the community):  No (Comment)  Discharge Needs  Concerns to be addressed:  Discharge Planning Concerns Readmission within the last 30 days:  No Current discharge risk:  None Barriers to Discharge:  No Barriers Identified   Roselle Norton, Dickey GaveJamie Lee, LCSW 11/01/2014, 5:05 PM

## 2014-11-01 NOTE — Clinical Social Work Placement (Signed)
   CLINICAL SOCIAL WORK PLACEMENT  NOTE  Date:  11/01/2014  Patient Details  Name: Karmen BongoHubert C Loveless MRN: [id number] Date of Birth: 03-Jul-1927  Clinical Social Work is seeking post-discharge placement for this patient at the Skilled  Nursing Facility level of care (*CSW will initial, date and re-position this form in  chart as items are completed):  No   Patient/family provided with [facility] Clinical Social Work Department's list of facilities offering this level of care within the geographic area requested by the patient (or if unable, by the patient's family).  Yes   Patient/family informed of their freedom to choose among providers that offer the needed level of care, that participate in Medicare, Medicaid or managed care program needed by the patient, have an available bed and are willing to accept the patient.  No   Patient/family informed of [place] [facility] ownership interest in [facility] and [facility], as well as of the fact that they are under no obligation to receive care at these facilities.  PASRR submitted to EDS on 11/01/14     PASRR number received on 11/01/14     Existing PASRR number confirmed on       FL2 transmitted to all facilities in geographic area requested by pt/family on 11/01/14     FL2 transmitted to all facilities within larger geographic area on       Patient informed that his/her managed care company has contracts with or will negotiate with certain facilities, including the following:        Yes   Patient/family informed of bed offers received.  Patient chooses bed at  [facility]     Physician recommends and patient chooses bed at      Patient to be transferred to  [facility]) on  .  Patient to be transferred to facility by       Patient family notified on   of transfer.  Name of family member notified:        PHYSICIAN       Additional Comment:    _______________________________________________ Royetta AsalHaidinger, Lesleyanne Politte Lee,  LCSW 11/01/2014, 5:11 PM

## 2014-11-01 NOTE — Evaluation (Signed)
Occupational Therapy Evaluation Patient Details Name: [name] MRN: [id number] DOB: 05-Aug-1927 Today's Date: 11/01/2014    History of Present Illness s/p R TKA   Clinical Impression   This 81 year old man was admitted for the above surgery.  He will benefit from skilled OT to increase safety and independence with adls.  Pt was independent with ADLs prior to admission.  Goals in acute are for supervision; current status is min A    Follow Up Recommendations  SNF    Equipment Recommendations  3 in 1 bedside comode    Recommendations for Other Services       Precautions / Restrictions Precautions Precautions: Knee Required Braces or Orthoses: Knee Immobilizer - Right Restrictions Weight Bearing Restrictions: No      Mobility Bed Mobility               General bed mobility comments: OOB  Transfers Overall transfer level: Needs assistance Equipment used: Rolling walker (2 wheeled) Transfers: Sit to/from Stand Sit to Stand: Min assist         General transfer comment: cues for UE/LE placement    Balance                                            ADL Overall ADL's : Needs assistance/impaired     Grooming: Set up;Sitting   Upper Body Bathing: Set up;Sitting   Lower Body Bathing: Minimal assistance;Sit to/from stand   Upper Body Dressing : Set up;Sitting   Lower Body Dressing: Minimal assistance;Sit to/from stand                 General ADL Comments: pt has reachers at home.  He is able to reach to start pants but needs assistance with shoes/socks     Vision     Perception     Praxis      Pertinent Vitals/Pain Pain Assessment: 0-10 Pain Score: 2  Pain Location: R knee Pain Descriptors / Indicators: Sore Pain Intervention(s): Limited activity within patient's tolerance;Monitored during session;Premedicated before session;Repositioned;Ice applied     Hand Dominance     Extremity/Trunk Assessment Upper  Extremity Assessment Upper Extremity Assessment: Overall WFL for tasks assessed           Communication Communication Communication: No difficulties   Cognition Arousal/Alertness: Awake/alert Behavior During Therapy: WFL for tasks assessed/performed Overall Cognitive Status: Within Functional Limits for tasks assessed                     General Comments       Exercises       Shoulder Instructions      Home Living Family/patient expects to be discharged to:: Skilled nursing facility Living Arrangements: Spouse/significant other                                      Prior Functioning/Environment Level of Independence: Independent             OT Diagnosis: Generalized weakness   OT Problem List: Decreased strength;Decreased activity tolerance;Pain   OT Treatment/Interventions: Self-care/ADL training;Patient/family education;DME and/or AE instruction    OT Goals(Current goals can be found in the care plan section) Acute Rehab OT Goals Patient Stated Goal: get back to being independent OT Goal Formulation: With patient  Time For Goal Achievement: 11/08/14 Potential to Achieve Goals: Good ADL Goals Pt Will Perform Grooming: with supervision;standing Pt Will Transfer to Toilet: with supervision;ambulating;bedside commode Pt Will Perform Toileting - Clothing Manipulation and hygiene: with supervision;sit to/from stand  OT Frequency: Min 2X/week   Barriers to D/C:            Co-evaluation              End of Session    Activity Tolerance: Patient tolerated treatment well Patient left: in chair;with call bell/phone within reach;with family/visitor present   Time: [id number] OT Time Calculation (min): 15 min Charges:  OT General Charges $OT Visit: 1 Procedure OT Evaluation $Initial OT Evaluation Tier I: 1 Procedure G-Codes:    Eaden Hettinger 11/01/2014, 11:36 AM  Marica OtterMaryellen Onie Hayashi, OTR/L [phone] 11/01/2014

## 2014-11-02 LAB — BASIC METABOLIC PANEL
ANION GAP: 9 (ref 5–15)
BUN: 19 mg/dL (ref 6–20)
CALCIUM: 8.3 mg/dL — AB (ref 8.9–10.3)
CHLORIDE: 104 mmol/L (ref 101–111)
CO2: 23 mmol/L (ref 22–32)
Creatinine, Ser: 0.72 mg/dL (ref 0.61–1.24)
GFR calc Af Amer: >60 mL/min (ref >60)
Glucose, Bld: 104 mg/dL — ABNORMAL HIGH (ref 65–99)
Potassium: 4 mmol/L (ref 3.5–5.1)
SODIUM: 136 mmol/L (ref 135–145)

## 2014-11-02 LAB — CBC
HCT: 35.9 % — ABNORMAL LOW (ref 39.0–52.0)
Hemoglobin: 11.8 g/dL — ABNORMAL LOW (ref 13.0–17.0)
MCH: 31.1 pg (ref 26.0–34.0)
MCHC: 32.9 g/dL (ref 30.0–36.0)
MCV: 94.5 fL (ref 78.0–100.0)
Platelets: 154 10*3/uL (ref 150–400)
RBC: 3.8 MIL/uL — ABNORMAL LOW (ref 4.22–5.81)
RDW: 13.5 % (ref 11.5–15.5)
WBC: 14.4 10*3/uL — AB (ref 4.0–10.5)

## 2014-11-02 NOTE — Progress Notes (Signed)
Physical Therapy Treatment Patient Details Name: [name] MRN: [id number] DOB: [birth year] Today's Date: 11/02/2014    History of Present Illness s/p R TKA    PT Comments    POD # 2 applied KI and instructed on use for amb.  Assisted OOB to amb in hallway then retuned to room to perform TKR TE's followed by ICE.   Follow Up Recommendations  SNF     Equipment Recommendations       Recommendations for Other Services       Precautions / Restrictions Precautions Precautions: Knee Precaution Comments: instructed pt on KI use for amb Required Braces or Orthoses: Knee Immobilizer - Right Knee Immobilizer - Right: Discontinue once straight leg raise with < 10 degree lag Restrictions Weight Bearing Restrictions: No Other Position/Activity Restrictions: WBAT    Mobility  Bed Mobility Overal bed mobility: Needs Assistance Bed Mobility: Supine to Sit     Supine to sit: Min assist     General bed mobility comments: cues for sequence and use of L LE to self assist  Transfers Overall transfer level: Needs assistance Equipment used: Rolling walker (2 wheeled) Transfers: Sit to/from Stand Sit to Stand: Min assist         General transfer comment: cues for UE/LE placement plus increased time  Ambulation/Gait Ambulation/Gait assistance: Min assist Ambulation Distance (Feet): 68 Feet Assistive device: Rolling walker (2 wheeled) Gait Pattern/deviations: Step-to pattern;Decreased stance time - right;Trunk flexed Gait velocity: decreased   General Gait Details: cues for sequence, posture and position from [employer]   Stairs            Wheelchair Mobility    Modified Rankin (Stroke Patients Only)       Balance                                    Cognition Arousal/Alertness: Awake/alert Behavior During Therapy: WFL for tasks assessed/performed Overall Cognitive Status: Within Functional Limits for tasks assessed                       Exercises   Total Knee Replacement TE's 10 reps B LE ankle pumps 10 reps towel squeezes 10 reps knee presses 10 reps heel slides  10 reps SAQ's 10 reps SLR's 10 reps ABD Followed by ICE     General Comments        Pertinent Vitals/Pain Pain Assessment: 0-10 Pain Score: 4  Pain Location: R knee Pain Descriptors / Indicators: Aching;Sore Pain Intervention(s): Monitored during session;Premedicated before session;Repositioned;Ice applied    Home Living                      Prior Function            PT Goals (current goals can now be found in the care plan section) Progress towards PT goals: Progressing toward goals    Frequency  7X/week    PT Plan Current plan remains appropriate    Co-evaluation             End of Session Equipment Utilized During Treatment: Gait belt;Right knee immobilizer Activity Tolerance: Patient tolerated treatment well Patient left: in chair;with call bell/phone within reach     Time: 0917-0945 PT Time Calculation (min) (ACUTE ONLY): 28 min  Charges:  $Gait Training: 8-22 mins $Therapeutic Exercise: 8-22 mins  G Codes:      Rica Koyanagi  PTA [facility]  Acute  Rehab Pager      [phone]

## 2014-11-02 NOTE — Progress Notes (Signed)
   Subjective: 2 Days Post-Op Procedure(s) (LRB): RIGHT TOTAL KNEE ARTHROPLASTY (Right) Patient reports pain as mild.   Patient seen in rounds with Dr. Lequita HaltAluisio. Patient is well, but has had some minor complaints of pain in the knee, requiring pain medications [facility]   Objective: Vital signs in last 24 hours: Temp:  [97.5 F (36.4 C)-98.6 F (37 C)] 98.4 F (36.9 C) (06/08 0508) Pulse Rate:  [66-77] 77 (06/08 0508) Resp:  [16-18] 16 (06/08 0508) BP: (102-113)/(48-60) 113/48 mmHg (06/08 0508) SpO2:  [95 %-99 %] 95 % (06/08 0508)  Intake/Output from previous day:  Intake/Output Summary (Last 24 hours) at 11/02/14 0710 Last data filed at 11/02/14 0508  Gross per 24 hour  Intake      0 ml  Output    775 ml  Net   -775 ml     Labs:  Recent Labs  11/01/14 0425 11/02/14 0433  HGB 12.6* 11.8*    Recent Labs  11/01/14 0425 11/02/14 0433  WBC 16.2* 14.4*  RBC 4.08* 3.80*  HCT 38.4* 35.9*  PLT 177 154    Recent Labs  11/01/14 0425 11/02/14 0433  NA 140 136  K 4.5 4.0  CL 104 104  CO2 25 23  BUN 17 19  CREATININE 0.95 0.72  GLUCOSE 147* 104*  CALCIUM 8.9 8.3*   No results for input(s): LABPT, INR in the last 72 hours.  EXAM: General - Patient is Alert, Appropriate and Oriented Extremity - Neurovascular intact Sensation intact distally Dorsiflexion/Plantar flexion intact Incision - clean, dry, no drainage Motor Function - intact, moving foot and toes well on exam.   Assessment/Plan: 2 Days Post-Op Procedure(s) (LRB): RIGHT TOTAL KNEE ARTHROPLASTY (Right) Procedure(s) (LRB): RIGHT TOTAL KNEE ARTHROPLASTY (Right) Past Medical History  Diagnosis Date  . Arthritis     STATES HIS SHOULDERS ARE "FROZEN" - VERY LIMITIED RANGE OF MOTION; OA BOTH KNEES  . Hyperlipidemia   . Psoriasis   . Coronary artery disease     "MILD NONCRITICAL CORONARY ARTERY DISEASE " - PER OFFICE NOTE DR. Jacinto HalimGANJI 02/27/12 - PT NO LONGER HAS TO SEE  CARDIOLGIST UNLESS HE HAS A NEED.  Marland Kitchen. Hx: UTI (urinary tract infection)    Principal Problem:   OA (osteoarthritis) of knee  Estimated body mass index is 34.03 kg/(m^2) as calculated from the following:   Height as of this encounter: 6' (1.829 m).   Weight as of this encounter: 113.853 kg (251 lb). Discharge to SNF Diet - Cardiac diet Follow up - in 2 weeks Activity - WBAT Disposition - Skilled nursing facility Condition Upon Discharge - Good D/C Meds - See DC Summary DVT Prophylaxis - Xarelto  Avel Peacerew Andrews Tener, PA-C Orthopaedic Surgery 11/02/2014, 7:10 AM

## 2014-11-02 NOTE — Clinical Social Work Placement (Signed)
   CLINICAL SOCIAL WORK PLACEMENT  NOTE  Date:  11/02/2014  Patient Details  Name: [name] MRN: [id number] Date of Birth: 05/13/1928  Clinical Social Work is seeking post-discharge placement for this patient at the Skilled  Nursing Facility level of care (*CSW will initial, date and re-position this form in  chart as items are completed):  No   Patient/family provided with [facility] Clinical Social Work Department's list of facilities offering this level of care within the geographic area requested by the patient (or if unable, by the patient's family).  Yes   Patient/family informed of their freedom to choose among providers that offer the needed level of care, that participate in Medicare, Medicaid or managed care program needed by the patient, have an available bed and are willing to accept the patient.  No   Patient/family informed of [facility]'s ownership interest in [facility] and [facility], as well as of the fact that they are under no obligation to receive care at these facilities.  PASRR submitted to EDS on 11/01/14     PASRR number received on 11/01/14     Existing PASRR number confirmed on       FL2 transmitted to all facilities in geographic area requested by pt/family on 11/01/14     FL2 transmitted to all facilities within larger geographic area on       Patient informed that his/her managed care company has contracts with or will negotiate with certain facilities, including the following:        Yes   Patient/family informed of bed offers received.  Patient chooses bed at  [facility]     Physician recommends and patient chooses bed at      Patient to be transferred to  [facility] on 11/02/14.  Patient to be transferred to facility by CAR     Patient family notified on 11/02/14 of transfer.  Name of family member notified:  SPOUSE     PHYSICIAN       Additional Comment: Pt / family [facility]. Pt able to transport by car. Insurance provided prior authorization. NSG reviewed d/c summary, scripts, avs. Scripts included in d/c packet. D/c packet provided to pt prior to d/c.   _______________________________________________ Royetta AsalHaidinger, Askia Hazelip Lee, LCSW 11/02/2014, 4:02 PM

## 2015-08-09 DIAGNOSIS — R0689 Other abnormalities of breathing: Secondary | ICD-10-CM | POA: Diagnosis not present

## 2015-08-09 DIAGNOSIS — R0600 Dyspnea, unspecified: Secondary | ICD-10-CM | POA: Diagnosis not present

## 2015-08-09 DIAGNOSIS — I2510 Atherosclerotic heart disease of native coronary artery without angina pectoris: Secondary | ICD-10-CM | POA: Diagnosis not present

## 2015-08-09 DIAGNOSIS — R002 Palpitations: Secondary | ICD-10-CM | POA: Diagnosis not present

## 2015-08-29 DIAGNOSIS — Z96651 Presence of right artificial knee joint: Secondary | ICD-10-CM | POA: Diagnosis not present

## 2015-08-29 DIAGNOSIS — Z471 Aftercare following joint replacement surgery: Secondary | ICD-10-CM | POA: Diagnosis not present

## 2015-08-30 DIAGNOSIS — I10 Essential (primary) hypertension: Secondary | ICD-10-CM | POA: Diagnosis not present

## 2015-08-30 DIAGNOSIS — R002 Palpitations: Secondary | ICD-10-CM | POA: Diagnosis not present

## 2015-09-04 DIAGNOSIS — Z125 Encounter for screening for malignant neoplasm of prostate: Secondary | ICD-10-CM | POA: Diagnosis not present

## 2015-09-04 DIAGNOSIS — I2510 Atherosclerotic heart disease of native coronary artery without angina pectoris: Secondary | ICD-10-CM | POA: Diagnosis not present

## 2015-09-04 DIAGNOSIS — E7800 Pure hypercholesterolemia, unspecified: Secondary | ICD-10-CM | POA: Diagnosis not present

## 2015-09-04 DIAGNOSIS — I10 Essential (primary) hypertension: Secondary | ICD-10-CM | POA: Diagnosis not present

## 2016-02-29 DIAGNOSIS — I10 Essential (primary) hypertension: Secondary | ICD-10-CM | POA: Diagnosis not present

## 2016-02-29 DIAGNOSIS — Z125 Encounter for screening for malignant neoplasm of prostate: Secondary | ICD-10-CM | POA: Diagnosis not present

## 2016-03-05 DIAGNOSIS — I2510 Atherosclerotic heart disease of native coronary artery without angina pectoris: Secondary | ICD-10-CM | POA: Diagnosis not present

## 2016-03-05 DIAGNOSIS — Z23 Encounter for immunization: Secondary | ICD-10-CM | POA: Diagnosis not present

## 2016-03-05 DIAGNOSIS — L409 Psoriasis, unspecified: Secondary | ICD-10-CM | POA: Diagnosis not present

## 2016-03-05 DIAGNOSIS — E7800 Pure hypercholesterolemia, unspecified: Secondary | ICD-10-CM | POA: Diagnosis not present

## 2016-03-05 DIAGNOSIS — I10 Essential (primary) hypertension: Secondary | ICD-10-CM | POA: Diagnosis not present

## 2016-07-29 ENCOUNTER — Encounter: Payer: Self-pay | Admitting: Internal Medicine

## 2016-07-30 ENCOUNTER — Encounter: Payer: Self-pay | Admitting: Internal Medicine

## 2016-07-30 DIAGNOSIS — J45909 Unspecified asthma, uncomplicated: Secondary | ICD-10-CM | POA: Insufficient documentation

## 2016-07-30 NOTE — Progress Notes (Signed)
Opened in error; Disregard.

## 2016-08-07 DIAGNOSIS — R600 Localized edema: Secondary | ICD-10-CM | POA: Diagnosis not present

## 2016-08-07 DIAGNOSIS — I2510 Atherosclerotic heart disease of native coronary artery without angina pectoris: Secondary | ICD-10-CM | POA: Diagnosis not present

## 2016-08-07 DIAGNOSIS — R0689 Other abnormalities of breathing: Secondary | ICD-10-CM | POA: Diagnosis not present

## 2016-08-07 DIAGNOSIS — R0600 Dyspnea, unspecified: Secondary | ICD-10-CM | POA: Diagnosis not present

## 2016-08-28 DIAGNOSIS — I10 Essential (primary) hypertension: Secondary | ICD-10-CM | POA: Diagnosis not present

## 2016-08-29 ENCOUNTER — Emergency Department (HOSPITAL_COMMUNITY)
Admission: EM | Admit: 2016-08-29 | Discharge: 2016-08-29 | Disposition: A | Discharge status: Home / Self Care | Payer: PPO | Attending: Emergency Medicine | Admitting: Emergency Medicine

## 2016-08-29 ENCOUNTER — Emergency Department (HOSPITAL_COMMUNITY): Payer: PPO

## 2016-08-29 ENCOUNTER — Encounter (HOSPITAL_COMMUNITY): Payer: Self-pay | Admitting: Emergency Medicine

## 2016-08-29 DIAGNOSIS — Z7982 Long term (current) use of aspirin: Secondary | ICD-10-CM | POA: Diagnosis not present

## 2016-08-29 DIAGNOSIS — J45909 Unspecified asthma, uncomplicated: Secondary | ICD-10-CM | POA: Insufficient documentation

## 2016-08-29 DIAGNOSIS — S8002XA Contusion of left knee, initial encounter: Secondary | ICD-10-CM | POA: Diagnosis not present

## 2016-08-29 DIAGNOSIS — Z79899 Other long term (current) drug therapy: Secondary | ICD-10-CM | POA: Diagnosis not present

## 2016-08-29 DIAGNOSIS — Z7901 Long term (current) use of anticoagulants: Secondary | ICD-10-CM | POA: Diagnosis not present

## 2016-08-29 DIAGNOSIS — Z87891 Personal history of nicotine dependence: Secondary | ICD-10-CM | POA: Diagnosis not present

## 2016-08-29 DIAGNOSIS — W228XXA Striking against or struck by other objects, initial encounter: Secondary | ICD-10-CM | POA: Diagnosis not present

## 2016-08-29 DIAGNOSIS — Y939 Activity, unspecified: Secondary | ICD-10-CM | POA: Diagnosis not present

## 2016-08-29 DIAGNOSIS — Y999 Unspecified external cause status: Secondary | ICD-10-CM | POA: Diagnosis not present

## 2016-08-29 DIAGNOSIS — H05232 Hemorrhage of left orbit: Secondary | ICD-10-CM

## 2016-08-29 DIAGNOSIS — S0990XA Unspecified injury of head, initial encounter: Secondary | ICD-10-CM | POA: Diagnosis not present

## 2016-08-29 DIAGNOSIS — Z96653 Presence of artificial knee joint, bilateral: Secondary | ICD-10-CM | POA: Diagnosis not present

## 2016-08-29 DIAGNOSIS — S0012XA Contusion of left eyelid and periocular area, initial encounter: Secondary | ICD-10-CM | POA: Diagnosis not present

## 2016-08-29 DIAGNOSIS — Y929 Unspecified place or not applicable: Secondary | ICD-10-CM | POA: Diagnosis not present

## 2016-08-29 DIAGNOSIS — W19XXXA Unspecified fall, initial encounter: Secondary | ICD-10-CM

## 2016-08-29 DIAGNOSIS — S8992XA Unspecified injury of left lower leg, initial encounter: Secondary | ICD-10-CM | POA: Diagnosis not present

## 2016-08-29 DIAGNOSIS — I2510 Atherosclerotic heart disease of native coronary artery without angina pectoris: Secondary | ICD-10-CM | POA: Diagnosis not present

## 2016-08-29 DIAGNOSIS — S0512XA Contusion of eyeball and orbital tissues, left eye, initial encounter: Secondary | ICD-10-CM | POA: Diagnosis not present

## 2016-08-29 MED ORDER — ACETAMINOPHEN 500 MG PO TABS: 1000.0000 mg | ORAL_TABLET | Freq: Once | ORAL | Status: DC

## 2016-08-29 NOTE — ED Triage Notes (Signed)
Pt hit his foot on stairs while trying to walk up, landed on his knees and hit left side of face on concrete. Bleeding controlled, large hematoma above left eye. Pt denies difficulty seeing.

## 2016-08-29 NOTE — ED Provider Notes (Signed)
[facility]-EMERGENCY DEPT Provider Note   CSN: [id number] Arrival date & time: 08/29/16  1639     History   Chief Complaint Chief Complaint  Patient presents with  . Fall    HPI [name] is a 83 y.o. male.  HPI Patient was assisting someone else up steps and he ended up falling himself. He landed striking his left eye and knees on the cement steps. No loss of consciousness. He got up and went home. This occurred around 2:00. He had been placing ice on the periorbital hematoma. He states he never developed a headache or confusion. He has been ambulatory. He reports neighbors and advised him he needed to be checked at the hospital when they observed the large swelling and bruising that he had developed. He takes a daily aspirin. Past Medical History:  Diagnosis Date  . Arthritis    STATES HIS SHOULDERS ARE "FROZEN" - VERY LIMITIED RANGE OF MOTION; OA BOTH KNEES  . Coronary artery disease    "MILD NONCRITICAL CORONARY ARTERY DISEASE " - PER OFFICE NOTE DR. Jacinto Halim 02/27/12 - PT NO LONGER HAS TO SEE CARDIOLGIST UNLESS HE HAS A NEED.  Marland Kitchen Hx: UTI (urinary tract infection)   . Hyperlipidemia   . Psoriasis     Patient Active Problem List   Diagnosis Date Noted  . Asthma   . Hyponatremia 10/07/2013  . Postoperative anemia due to acute blood loss 10/07/2013  . OA (osteoarthritis) of knee 10/04/2013    Past Surgical History:  Procedure Laterality Date  . CARDIAC CATHETERIZATION  02-05-11  . TONSILLECTOMY     AGE 67  . TOTAL KNEE ARTHROPLASTY Left 10/04/2013   Procedure: LEFT TOTAL KNEE ARTHROPLASTY;  Surgeon: Loanne Drilling, MD;  Location: [facility] ORS;  Service: Orthopedics;  Laterality: Left;  . TOTAL KNEE ARTHROPLASTY Right 10/31/2014   Procedure: RIGHT TOTAL KNEE ARTHROPLASTY;  Surgeon: Ollen Gross, MD;  Location: [facility] ORS;  Service: Orthopedics;  Laterality: Right;       Home Medications    Prior to Admission medications   Medication Sig Start Date End Date Taking? Authorizing  Provider  aspirin EC 81 MG tablet Take 81 mg by mouth daily.   Yes Historical Provider, MD  metoprolol succinate (TOPROL-XL) 25 MG 24 hr tablet Take 25 mg by mouth at bedtime. 08/29/16  Yes Historical Provider, MD  Multiple Vitamin (MULTIVITAMIN WITH MINERALS) TABS tablet Take 1 tablet by mouth daily.   Yes Historical Provider, MD  Omega-3 Fatty Acids (FISH OIL PO) Take 1 capsule by mouth at bedtime.   Yes Historical Provider, MD  simvastatin (ZOCOR) 80 MG tablet Take 40 mg by mouth every evening.    Yes Historical Provider, MD  rivaroxaban (XARELTO) 10 MG TABS tablet Take 1 tablet (10 mg total) by mouth daily with breakfast. Take Xarelto for two and a half more weeks, then discontinue Xarelto. Once the patient has completed the Xarelto, they may resume the 81 mg Aspirin. Patient not taking: Reported on 08/29/2016 11/01/14   Alexzandrew Tessie Fass, PA-C    Family History No family history on file.  Social History Social History  Substance Use Topics  . Smoking status: Former Smoker    Years: 20.00    Types: Cigarettes, Pipe, Cigars    Quit date: 10/25/1993  . Smokeless tobacco: Never Used  . Alcohol use Yes     Comment:   RARE ALCOHOL     Allergies   Oxycodone   Review of Systems Review of Systems  10 Systems reviewed and are negative for acute change except as noted in the HPI. Physical Exam Updated Vital Signs BP 132/71 (BP Location: Left Arm)   Pulse 70   Temp 97.8 F (36.6 C) (Oral)   Resp 18   Ht 6' (1.829 m)   Wt 250 lb (113.4 kg)   SpO2 100%   BMI 33.91 kg/m   Physical Exam  Constitutional: He is oriented to person, place, and time. He appears well-developed and well-nourished. No distress.  HENT:  Patient has large, deep violaceous ecchymoses surrounding the left eye. Superficial abrasion above the brow. No active bleeding. Tenderness to palpation around the brow ridge. No bleeding from the nares. Posterior oropharynx widely patent. Dentition intact. Bilateral TMs no  hemotympanum.  Eyes: Conjunctivae are normal.  Pupils equally round reactive to light. No hyphema left eye. Slight scleral injection lateral aspect of left eye. Extraocular motions normal.  Neck: Neck supple.  No C-spine tenderness to palpation.  Cardiovascular: Normal rate, regular rhythm, normal heart sounds and intact distal pulses.   No murmur heard. Pulmonary/Chest: Effort normal and breath sounds normal. No respiratory distress. He exhibits no tenderness.  Abdominal: Soft. He exhibits no distension. There is no tenderness.  Musculoskeletal: Normal range of motion. He exhibits no edema, tenderness or deformity.  Very superficial abrasion to left knee. No active bleeding. No effusion.  Neurological: He is alert and oriented to person, place, and time. No cranial nerve deficit. He exhibits normal muscle tone. Coordination normal.  Skin: Skin is warm and dry.  Psychiatric: He has a normal mood and affect.  Nursing note and vitals reviewed.    ED Treatments / Results  Labs (all labs ordered are listed, but only abnormal results are displayed) Labs Reviewed - No data to display  EKG  EKG Interpretation None       Radiology Ct Head Wo Contrast  Result Date: 08/29/2016 CLINICAL DATA:  Tripped and fell on stairs today, large LEFT periorbital hematoma. EXAM: CT HEAD WITHOUT CONTRAST CT MAXILLOFACIAL WITHOUT CONTRAST TECHNIQUE: Multidetector CT imaging of the head and maxillofacial structures were performed using the standard protocol without intravenous contrast. Multiplanar CT image reconstructions of the maxillofacial structures were also generated. COMPARISON:  None. FINDINGS: CT HEAD FINDINGS BRAIN: No intraparenchymal hemorrhage, mass effect nor midline shift. The ventricles and sulci are normal for age. Patchy supratentorial white matter hypodensities less than expected for patient's age, though non-specific are most compatible with chronic small vessel ischemic disease. No acute  large vascular territory infarcts. No abnormal extra-axial fluid collections. Basal cisterns are patent. VASCULAR: Moderate calcific atherosclerosis of the carotid siphons. SKULL: No skull fracture. Small RIGHT frontal calvarial hemangioma. No significant scalp soft tissue swelling. OTHER: None. CT MAXILLOFACIAL FINDINGS OSSEOUS: The mandible is intact, the condyles are located. No acute facial fracture. No destructive bony lesions. ORBITS: Ocular globes intact, lenses are located. Preservation of the orbital fat. Normal appearance of the optic nerve sheath complexes and extra-ocular muscles. SINUSES: Trace paranasal sinus mucosal thickening. Nasal septum deviated the RIGHT. Included mastoid aircells are well aerated. SOFT TISSUES: LEFT pre malar and periorbital soft tissue swelling without subcutaneous gas or radiopaque foreign bodies. IMPRESSION: CT HEAD: Negative CT HEAD. CT MAXILLOFACIAL: LEFT periorbital and LEFT facial soft tissue swelling without postseptal hematoma. No acute facial fracture. Electronically Signed   By: Awilda Metro M.D.   On: 08/29/2016 18:33   Ct Maxillofacial Wo Cm  Result Date: 08/29/2016 CLINICAL DATA:  Tripped and fell on stairs today, large  LEFT periorbital hematoma. EXAM: CT HEAD WITHOUT CONTRAST CT MAXILLOFACIAL WITHOUT CONTRAST TECHNIQUE: Multidetector CT imaging of the head and maxillofacial structures were performed using the standard protocol without intravenous contrast. Multiplanar CT image reconstructions of the maxillofacial structures were also generated. COMPARISON:  None. FINDINGS: CT HEAD FINDINGS BRAIN: No intraparenchymal hemorrhage, mass effect nor midline shift. The ventricles and sulci are normal for age. Patchy supratentorial white matter hypodensities less than expected for patient's age, though non-specific are most compatible with chronic small vessel ischemic disease. No acute large vascular territory infarcts. No abnormal extra-axial fluid collections.  Basal cisterns are patent. VASCULAR: Moderate calcific atherosclerosis of the carotid siphons. SKULL: No skull fracture. Small RIGHT frontal calvarial hemangioma. No significant scalp soft tissue swelling. OTHER: None. CT MAXILLOFACIAL FINDINGS OSSEOUS: The mandible is intact, the condyles are located. No acute facial fracture. No destructive bony lesions. ORBITS: Ocular globes intact, lenses are located. Preservation of the orbital fat. Normal appearance of the optic nerve sheath complexes and extra-ocular muscles. SINUSES: Trace paranasal sinus mucosal thickening. Nasal septum deviated the RIGHT. Included mastoid aircells are well aerated. SOFT TISSUES: LEFT pre malar and periorbital soft tissue swelling without subcutaneous gas or radiopaque foreign bodies. IMPRESSION: CT HEAD: Negative CT HEAD. CT MAXILLOFACIAL: LEFT periorbital and LEFT facial soft tissue swelling without postseptal hematoma. No acute facial fracture. Electronically Signed   By: Awilda Metro M.D.   On: 08/29/2016 18:33    Procedures Procedures (including critical care time)  Medications Ordered in ED Medications  acetaminophen (TYLENOL) tablet 1,000 mg ( Oral Canceled Entry 08/29/16 1836)     Initial Impression / Assessment and Plan / ED Course  I have reviewed the triage vital signs and the nursing notes.  Pertinent labs & imaging results that were available during my care of the patient were reviewed by me and considered in my medical decision making (see chart for details).     Final Clinical Impressions(s) / ED Diagnoses   Final diagnoses:  Fall, initial encounter  Periorbital hematoma of left eye  Contusion of left knee, initial encounter  Injury of head, initial encounter   Patient has large periorbital hematoma. Eye exam however does not show hyphema and vision is intact. CT does not show orbital fracture. Patient is neurologically intact without findings of intracranial bleed on CT. At this time the patient  is safe for return home with observation by family members. Return precautions are reviewed. New Prescriptions New Prescriptions   No medications on file     Arby Barrette, MD 08/29/16 1842

## 2016-09-04 DIAGNOSIS — E7800 Pure hypercholesterolemia, unspecified: Secondary | ICD-10-CM | POA: Diagnosis not present

## 2016-09-04 DIAGNOSIS — Z125 Encounter for screening for malignant neoplasm of prostate: Secondary | ICD-10-CM | POA: Diagnosis not present

## 2016-09-04 DIAGNOSIS — Z0000 Encounter for general adult medical examination without abnormal findings: Secondary | ICD-10-CM | POA: Diagnosis not present

## 2016-09-04 DIAGNOSIS — I10 Essential (primary) hypertension: Secondary | ICD-10-CM | POA: Diagnosis not present

## 2016-09-11 DIAGNOSIS — H2513 Age-related nuclear cataract, bilateral: Secondary | ICD-10-CM | POA: Diagnosis not present

## 2016-09-11 DIAGNOSIS — H52223 Regular astigmatism, bilateral: Secondary | ICD-10-CM | POA: Diagnosis not present

## 2016-09-11 DIAGNOSIS — H5203 Hypermetropia, bilateral: Secondary | ICD-10-CM | POA: Diagnosis not present

## 2016-09-27 DIAGNOSIS — H25811 Combined forms of age-related cataract, right eye: Secondary | ICD-10-CM | POA: Diagnosis not present

## 2016-09-27 DIAGNOSIS — H25812 Combined forms of age-related cataract, left eye: Secondary | ICD-10-CM | POA: Diagnosis not present

## 2016-09-27 DIAGNOSIS — H11153 Pinguecula, bilateral: Secondary | ICD-10-CM | POA: Diagnosis not present

## 2016-10-10 DIAGNOSIS — H52222 Regular astigmatism, left eye: Secondary | ICD-10-CM | POA: Diagnosis not present

## 2016-10-10 DIAGNOSIS — H25812 Combined forms of age-related cataract, left eye: Secondary | ICD-10-CM | POA: Diagnosis not present

## 2016-10-10 DIAGNOSIS — Z9842 Cataract extraction status, left eye: Secondary | ICD-10-CM | POA: Diagnosis not present

## 2016-10-10 DIAGNOSIS — H5202 Hypermetropia, left eye: Secondary | ICD-10-CM | POA: Diagnosis not present

## 2016-10-10 DIAGNOSIS — H2512 Age-related nuclear cataract, left eye: Secondary | ICD-10-CM | POA: Diagnosis not present

## 2016-10-10 DIAGNOSIS — Z961 Presence of intraocular lens: Secondary | ICD-10-CM | POA: Diagnosis not present

## 2016-11-07 DIAGNOSIS — H2511 Age-related nuclear cataract, right eye: Secondary | ICD-10-CM | POA: Diagnosis not present

## 2016-11-07 DIAGNOSIS — H25811 Combined forms of age-related cataract, right eye: Secondary | ICD-10-CM | POA: Diagnosis not present

## 2016-11-21 DIAGNOSIS — L723 Sebaceous cyst: Secondary | ICD-10-CM | POA: Diagnosis not present

## 2016-12-10 DIAGNOSIS — N390 Urinary tract infection, site not specified: Secondary | ICD-10-CM | POA: Diagnosis not present

## 2016-12-10 DIAGNOSIS — R32 Unspecified urinary incontinence: Secondary | ICD-10-CM | POA: Diagnosis not present

## 2016-12-10 DIAGNOSIS — N3000 Acute cystitis without hematuria: Secondary | ICD-10-CM | POA: Diagnosis not present

## 2016-12-10 DIAGNOSIS — R509 Fever, unspecified: Secondary | ICD-10-CM | POA: Diagnosis not present

## 2017-01-15 DIAGNOSIS — N3020 Other chronic cystitis without hematuria: Secondary | ICD-10-CM | POA: Diagnosis not present

## 2017-01-15 DIAGNOSIS — B9620 Unspecified Escherichia coli [E. coli] as the cause of diseases classified elsewhere: Secondary | ICD-10-CM | POA: Diagnosis not present

## 2017-01-15 DIAGNOSIS — N390 Urinary tract infection, site not specified: Secondary | ICD-10-CM | POA: Diagnosis not present

## 2017-01-15 DIAGNOSIS — N401 Enlarged prostate with lower urinary tract symptoms: Secondary | ICD-10-CM | POA: Diagnosis not present

## 2017-01-15 DIAGNOSIS — R350 Frequency of micturition: Secondary | ICD-10-CM | POA: Diagnosis not present

## 2017-02-20 DIAGNOSIS — N401 Enlarged prostate with lower urinary tract symptoms: Secondary | ICD-10-CM | POA: Diagnosis not present

## 2017-02-20 DIAGNOSIS — R350 Frequency of micturition: Secondary | ICD-10-CM | POA: Diagnosis not present

## 2017-02-20 DIAGNOSIS — R3915 Urgency of urination: Secondary | ICD-10-CM | POA: Diagnosis not present

## 2017-02-20 DIAGNOSIS — N3020 Other chronic cystitis without hematuria: Secondary | ICD-10-CM | POA: Diagnosis not present

## 2017-03-05 DIAGNOSIS — I10 Essential (primary) hypertension: Secondary | ICD-10-CM | POA: Diagnosis not present

## 2017-03-05 DIAGNOSIS — Z0000 Encounter for general adult medical examination without abnormal findings: Secondary | ICD-10-CM | POA: Diagnosis not present

## 2017-03-05 DIAGNOSIS — Z125 Encounter for screening for malignant neoplasm of prostate: Secondary | ICD-10-CM | POA: Diagnosis not present

## 2017-03-07 DIAGNOSIS — Z23 Encounter for immunization: Secondary | ICD-10-CM | POA: Diagnosis not present

## 2017-03-14 DIAGNOSIS — Z125 Encounter for screening for malignant neoplasm of prostate: Secondary | ICD-10-CM | POA: Diagnosis not present

## 2017-03-14 DIAGNOSIS — E7800 Pure hypercholesterolemia, unspecified: Secondary | ICD-10-CM | POA: Diagnosis not present

## 2017-03-14 DIAGNOSIS — I10 Essential (primary) hypertension: Secondary | ICD-10-CM | POA: Diagnosis not present

## 2017-08-15 DIAGNOSIS — R0689 Other abnormalities of breathing: Secondary | ICD-10-CM | POA: Diagnosis not present

## 2017-08-15 DIAGNOSIS — R600 Localized edema: Secondary | ICD-10-CM | POA: Diagnosis not present

## 2017-08-15 DIAGNOSIS — I2510 Atherosclerotic heart disease of native coronary artery without angina pectoris: Secondary | ICD-10-CM | POA: Diagnosis not present

## 2017-08-15 DIAGNOSIS — R0600 Dyspnea, unspecified: Secondary | ICD-10-CM | POA: Diagnosis not present

## 2017-09-11 DIAGNOSIS — I10 Essential (primary) hypertension: Secondary | ICD-10-CM | POA: Diagnosis not present

## 2017-09-11 DIAGNOSIS — Z125 Encounter for screening for malignant neoplasm of prostate: Secondary | ICD-10-CM | POA: Diagnosis not present

## 2017-09-18 DIAGNOSIS — R972 Elevated prostate specific antigen [PSA]: Secondary | ICD-10-CM | POA: Diagnosis not present

## 2017-09-18 DIAGNOSIS — E7800 Pure hypercholesterolemia, unspecified: Secondary | ICD-10-CM | POA: Diagnosis not present

## 2017-09-18 DIAGNOSIS — L409 Psoriasis, unspecified: Secondary | ICD-10-CM | POA: Diagnosis not present

## 2017-09-18 DIAGNOSIS — J45909 Unspecified asthma, uncomplicated: Secondary | ICD-10-CM | POA: Diagnosis not present

## 2017-09-18 DIAGNOSIS — I10 Essential (primary) hypertension: Secondary | ICD-10-CM | POA: Diagnosis not present

## 2017-12-11 DIAGNOSIS — R972 Elevated prostate specific antigen [PSA]: Secondary | ICD-10-CM | POA: Diagnosis not present

## 2017-12-11 DIAGNOSIS — E7800 Pure hypercholesterolemia, unspecified: Secondary | ICD-10-CM | POA: Diagnosis not present

## 2017-12-18 DIAGNOSIS — E7800 Pure hypercholesterolemia, unspecified: Secondary | ICD-10-CM | POA: Diagnosis not present

## 2017-12-18 DIAGNOSIS — I10 Essential (primary) hypertension: Secondary | ICD-10-CM | POA: Diagnosis not present

## 2017-12-18 DIAGNOSIS — Z0000 Encounter for general adult medical examination without abnormal findings: Secondary | ICD-10-CM | POA: Diagnosis not present

## 2017-12-18 DIAGNOSIS — R972 Elevated prostate specific antigen [PSA]: Secondary | ICD-10-CM | POA: Diagnosis not present

## 2018-01-20 DIAGNOSIS — M47817 Spondylosis without myelopathy or radiculopathy, lumbosacral region: Secondary | ICD-10-CM | POA: Diagnosis not present

## 2018-01-20 DIAGNOSIS — M545 Low back pain: Secondary | ICD-10-CM | POA: Diagnosis not present

## 2018-02-03 DIAGNOSIS — M545 Low back pain: Secondary | ICD-10-CM | POA: Diagnosis not present

## 2018-02-09 DIAGNOSIS — N401 Enlarged prostate with lower urinary tract symptoms: Secondary | ICD-10-CM | POA: Diagnosis not present

## 2018-02-09 DIAGNOSIS — R972 Elevated prostate specific antigen [PSA]: Secondary | ICD-10-CM | POA: Diagnosis not present

## 2018-02-09 DIAGNOSIS — R3915 Urgency of urination: Secondary | ICD-10-CM | POA: Diagnosis not present

## 2018-02-09 DIAGNOSIS — R350 Frequency of micturition: Secondary | ICD-10-CM | POA: Diagnosis not present

## 2018-02-16 DIAGNOSIS — Z23 Encounter for immunization: Secondary | ICD-10-CM | POA: Diagnosis not present

## 2018-02-16 DIAGNOSIS — M545 Low back pain: Secondary | ICD-10-CM | POA: Diagnosis not present

## 2018-04-05 IMAGING — CT CT MAXILLOFACIAL W/O CM
3 series · 15 of 47 positions shown, 18 images · non-contrast
Comparison: None.

CLINICAL DATA: Tripped and fell on stairs today, large LEFT
periorbital hematoma.

EXAM:
CT HEAD WITHOUT CONTRAST
CT MAXILLOFACIAL WITHOUT CONTRAST
TECHNIQUE: Multidetector CT imaging of the head and maxillofacial structures
were performed using the standard protocol without intravenous
contrast. Multiplanar CT image reconstructions of the maxillofacial
structures were also generated.

[Series 3: facial st · axial · 0.42mm/px · z∈[-245,-91]mm · 9 of 91 slices shown, 12 images]
[im 7/91  brain]
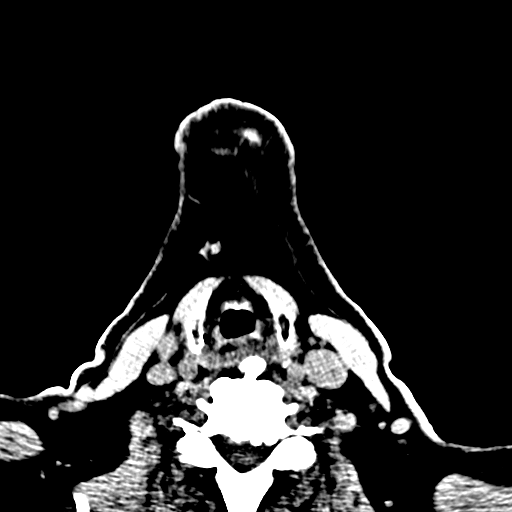
[im 7/91  bone]
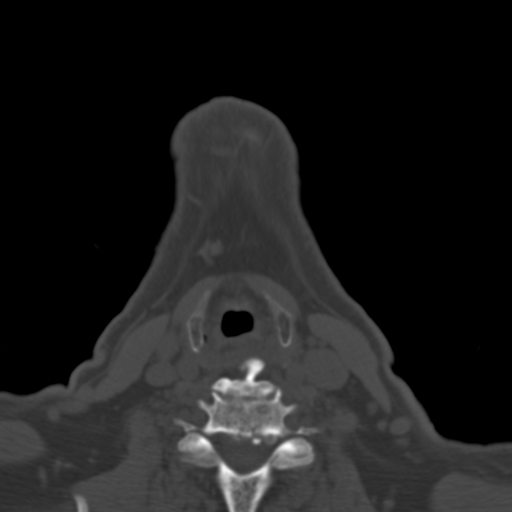
[im 16/91  bone]
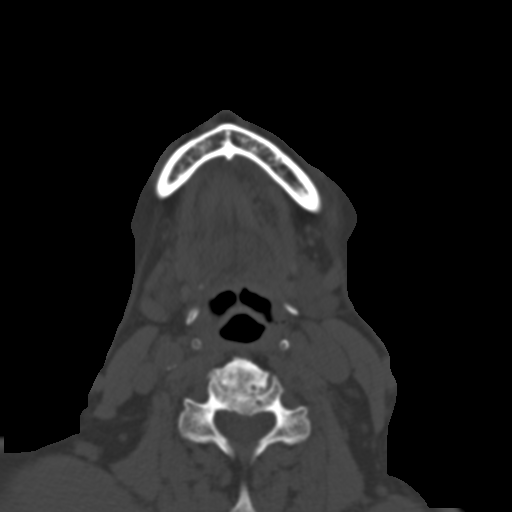
[im 25/91  bone]
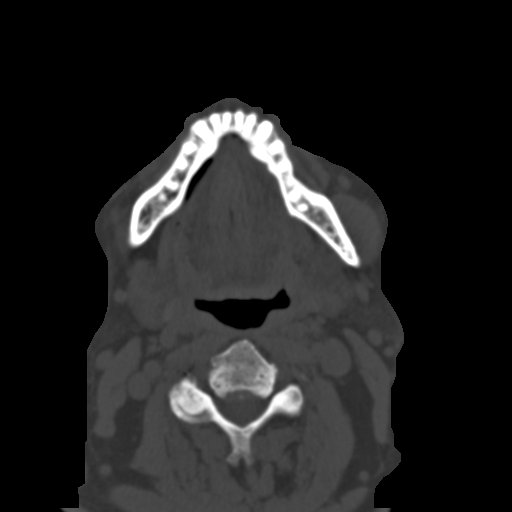
[im 35/91  bone]
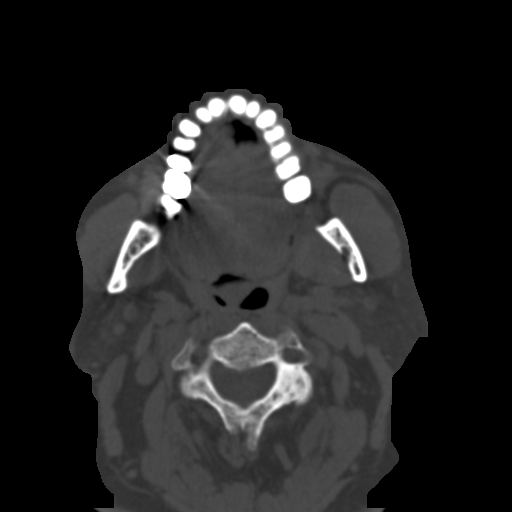
[im 47/91  brain]
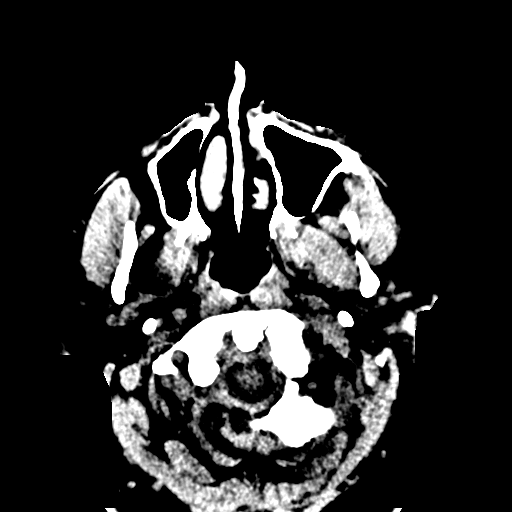
[im 47/91  bone]
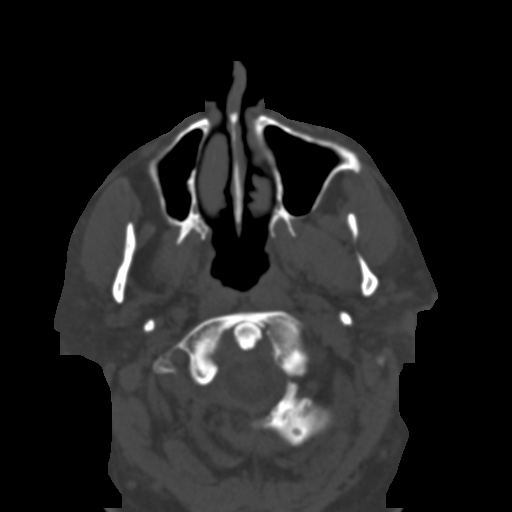
[im 56/91  bone]
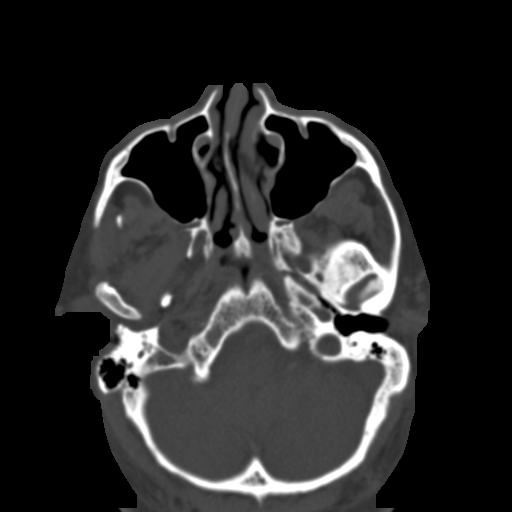
[im 66/91  bone]
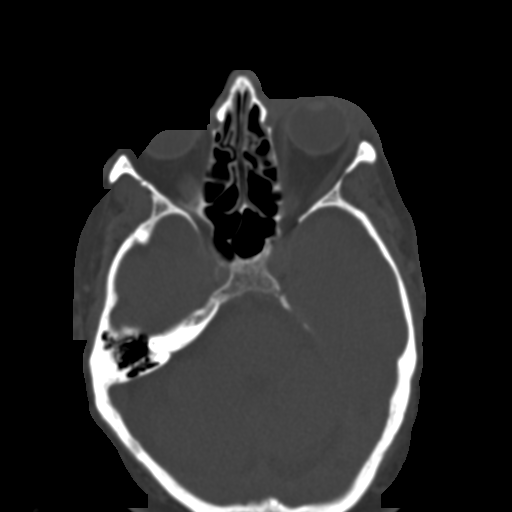
[im 75/91  bone]
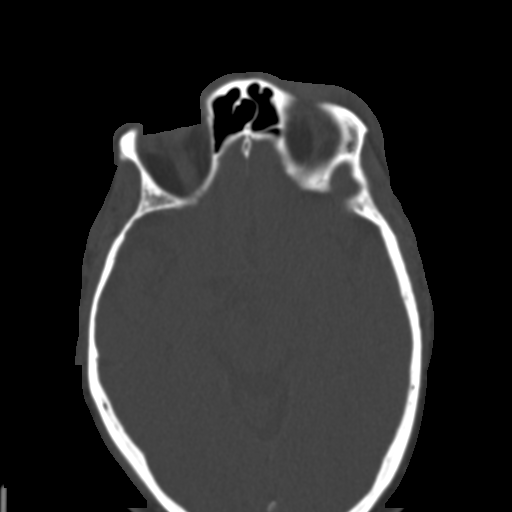
[im 84/91  brain]
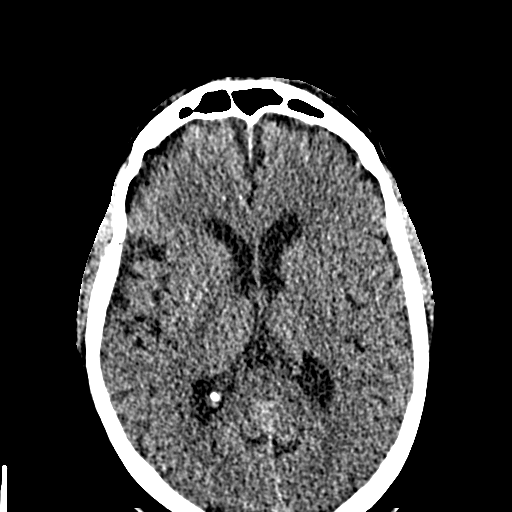
[im 84/91  bone]
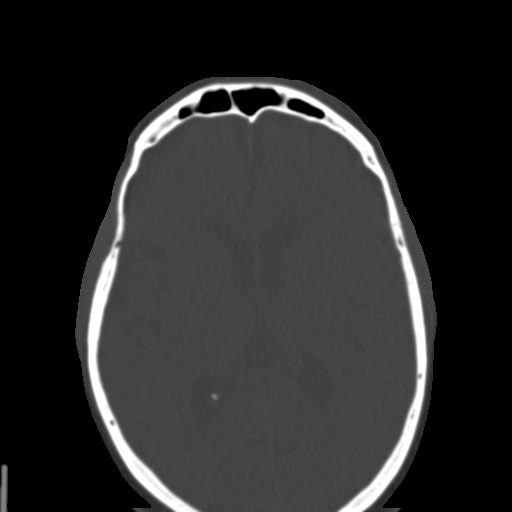

[Series 7: coronal st · coronal · 0.39mm/px · 3 of 79 slices shown]
[im 27/79  bone]
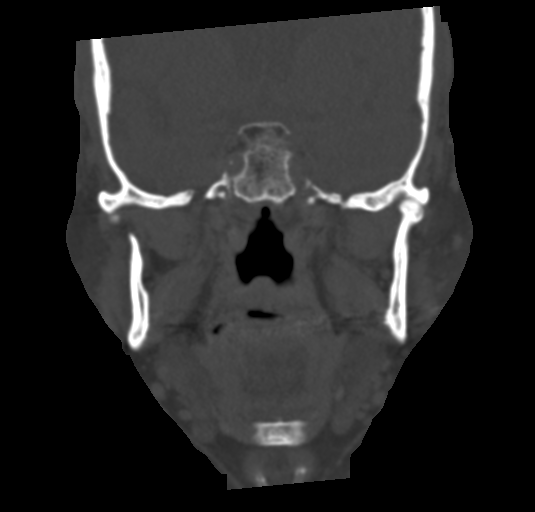
[im 35/79  bone]
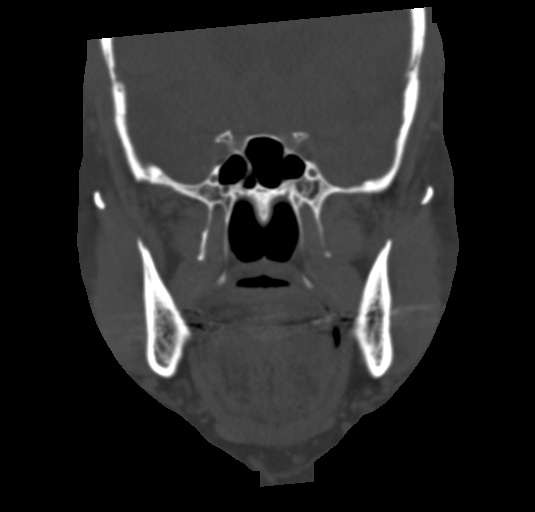
[im 44/79  bone]
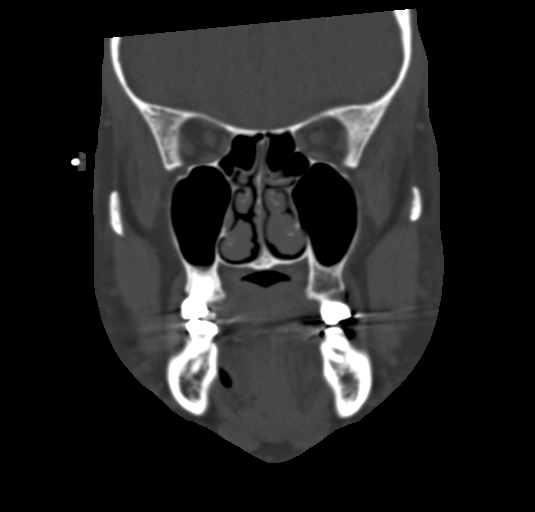

[Series 9: sagittal st · sagittal · 0.31mm/px · 3 of 106 slices shown]
[im 36/106  bone]
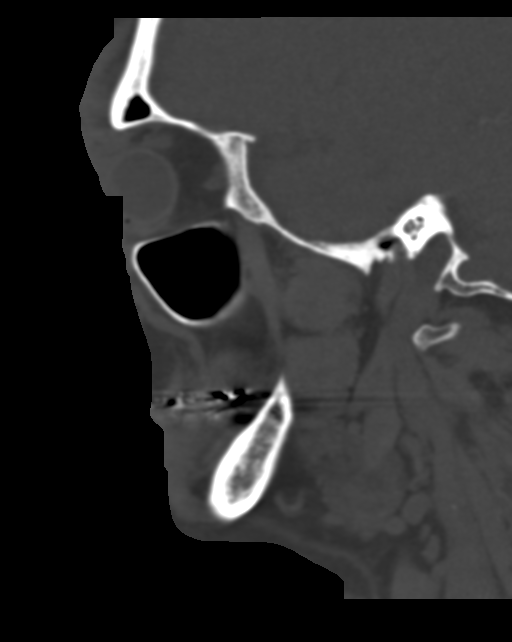
[im 53/106  bone]
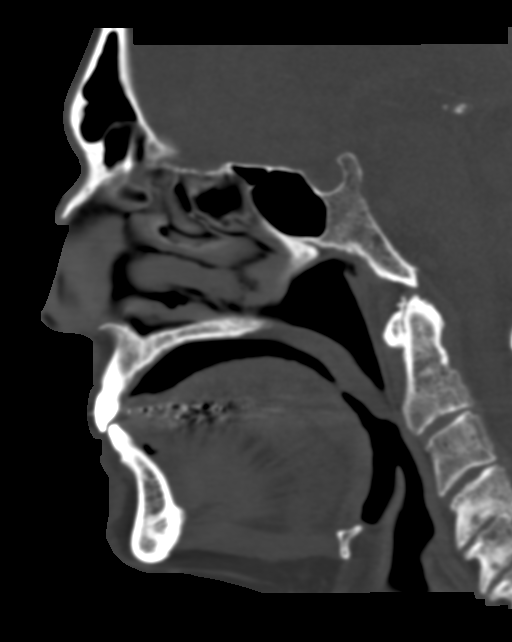
[im 70/106  bone]
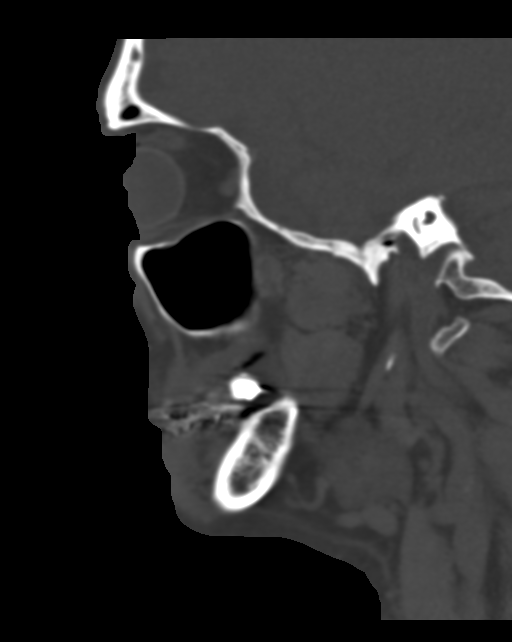

[15 of 47 positions shown; findings below may reference images not displayed]

FINDINGS: CT HEAD FINDINGS

BRAIN: No intraparenchymal hemorrhage, mass effect nor midline
shift. The ventricles and sulci are normal for age. Patchy
supratentorial white matter hypodensities less than expected for
patient's age, though non-specific are most compatible with chronic
small vessel ischemic disease. No acute large vascular territory
infarcts. No abnormal extra-axial fluid collections. Basal cisterns
are patent.

VASCULAR: Moderate calcific atherosclerosis of the carotid siphons.

SKULL: No skull fracture. Small RIGHT frontal calvarial hemangioma.
No significant scalp soft tissue swelling.

OTHER: None.

CT MAXILLOFACIAL FINDINGS

OSSEOUS: The mandible is intact, the condyles are located. No acute
facial fracture. No destructive bony lesions.

ORBITS: Ocular globes intact, lenses are located. Preservation of
the orbital fat. Normal appearance of the optic nerve sheath
complexes and extra-ocular muscles.

SINUSES: Trace paranasal sinus mucosal thickening. Nasal septum
deviated the RIGHT. Included mastoid aircells are well aerated.

SOFT TISSUES: LEFT pre malar and periorbital soft tissue swelling
without subcutaneous gas or radiopaque foreign bodies.
IMPRESSION: CT HEAD: Negative CT HEAD.

CT MAXILLOFACIAL: LEFT periorbital and LEFT facial soft tissue
swelling without postseptal hematoma. No acute facial fracture.

## 2018-06-17 DIAGNOSIS — Z0000 Encounter for general adult medical examination without abnormal findings: Secondary | ICD-10-CM | POA: Diagnosis not present

## 2018-06-17 DIAGNOSIS — E7800 Pure hypercholesterolemia, unspecified: Secondary | ICD-10-CM | POA: Diagnosis not present

## 2018-06-24 DIAGNOSIS — Z0000 Encounter for general adult medical examination without abnormal findings: Secondary | ICD-10-CM | POA: Diagnosis not present

## 2018-06-24 DIAGNOSIS — E7800 Pure hypercholesterolemia, unspecified: Secondary | ICD-10-CM | POA: Diagnosis not present

## 2018-06-24 DIAGNOSIS — R609 Edema, unspecified: Secondary | ICD-10-CM | POA: Diagnosis not present

## 2018-06-24 DIAGNOSIS — I10 Essential (primary) hypertension: Secondary | ICD-10-CM | POA: Diagnosis not present

## 2018-08-04 DIAGNOSIS — R972 Elevated prostate specific antigen [PSA]: Secondary | ICD-10-CM | POA: Diagnosis not present

## 2018-08-13 DIAGNOSIS — R350 Frequency of micturition: Secondary | ICD-10-CM | POA: Diagnosis not present

## 2018-08-13 DIAGNOSIS — N401 Enlarged prostate with lower urinary tract symptoms: Secondary | ICD-10-CM | POA: Diagnosis not present

## 2018-08-13 DIAGNOSIS — R972 Elevated prostate specific antigen [PSA]: Secondary | ICD-10-CM | POA: Diagnosis not present

## 2018-08-13 NOTE — Progress Notes (Deleted)
Subjective:  Primary Physician:  Pearson Grippe, MD  Patient ID: [name], male    DOB: 11/03/1927, 85 y.o.   MRN: [id number]  No chief complaint on file.   HPI: [name]  is a 85 y.o. male  with history of known coronary artery disease, coronary angiography in 2012 at revealed proximal LAD 50% stenosis and mild noncritical disease in other vessels with normal LVEF. However he was evaluated for palpitations in 2016 an echocardiogram revealed mild decrease in EF of 45%, mild RV dilatation with mild RV systolic dysfunction. Nuclear stress test was nonischemic in December 2016. He now presents here for one year follow-up.  His past medical history significant for hyperlipidemia, chronic palpitations, bilateral leg edema. He is presently doing well and no specific complaints today except for arthritis o fhis knee. He has not been exercising.   Past Medical History:  Diagnosis Date  . Arthritis    STATES HIS SHOULDERS ARE "FROZEN" - VERY LIMITIED RANGE OF MOTION; OA BOTH KNEES  . Coronary artery disease    "MILD NONCRITICAL CORONARY ARTERY DISEASE " - PER OFFICE NOTE DR. Jacinto Halim 02/27/12 - PT NO LONGER HAS TO SEE CARDIOLGIST UNLESS HE HAS A NEED.  Marland Kitchen Hx: UTI (urinary tract infection)   . Hyperlipidemia   . Psoriasis     Past Surgical History:  Procedure Laterality Date  . CARDIAC CATHETERIZATION  02-05-11  . TONSILLECTOMY     AGE 55  . TOTAL KNEE ARTHROPLASTY Left 10/04/2013   Procedure: LEFT TOTAL KNEE ARTHROPLASTY;  Surgeon: Loanne Drilling, MD;  Location: [facility];  Service: Orthopedics;  Laterality: Left;  . TOTAL KNEE ARTHROPLASTY Right 10/31/2014   Procedure: RIGHT TOTAL KNEE ARTHROPLASTY;  Surgeon: Ollen Gross, MD;  Location: [facility];  Service: Orthopedics;  Laterality: Right;    Social History   Socioeconomic History  . Marital status: Married    Spouse name: Not on file  . Number of children: Not on file  . Years of education: Not on file  . Highest education  level: Not on file  Occupational History  . Not on file  Social Needs  . Financial resource strain: Not on file  . Food insecurity:    Worry: Not on file    Inability: Not on file  . Transportation needs:    Medical: Not on file    Non-medical: Not on file  Tobacco Use  . Smoking status: Former Smoker    Years: 20.00    Types: Cigarettes, Pipe, Cigars    Last attempt to quit: 10/25/1993    Years since quitting: 24.8  . Smokeless tobacco: Never Used  Substance and Sexual Activity  . Alcohol use: Yes    Comment:   RARE ALCOHOL  . Drug use: No  . Sexual activity: Not on file  Lifestyle  . Physical activity:    Days per week: Not on file    Minutes per session: Not on file  . Stress: Not on file  Relationships  . Social connections:    Talks on phone: Not on file    Gets together: Not on file    Attends religious service: Not on file    Active member of club or organization: Not on file    Attends meetings of clubs or organizations: Not on file    Relationship status: Not on file  . Intimate partner violence:    Fear of current or ex partner: Not on file    Emotionally  abused: Not on file    Physically abused: Not on file    Forced sexual activity: Not on file  Other Topics Concern  . Not on file  Social History Narrative  . Not on file    Current Outpatient Medications on File Prior to Visit  Medication Sig Dispense Refill  . aspirin EC 81 MG tablet Take 81 mg by mouth daily.    . metoprolol succinate (TOPROL-XL) 25 MG 24 hr tablet Take 25 mg by mouth at bedtime.    . Multiple Vitamin (MULTIVITAMIN WITH MINERALS) TABS tablet Take 1 tablet by mouth daily.    . Omega-3 Fatty Acids (FISH OIL PO) Take 1 capsule by mouth at bedtime.    . rivaroxaban (XARELTO) 10 MG TABS tablet Take 1 tablet (10 mg total) by mouth daily with breakfast. Take Xarelto for two and a half more weeks, then discontinue Xarelto. Once the patient has completed the Xarelto, they may resume the 81 mg  Aspirin. (Patient not taking: Reported on 08/29/2016) 19 tablet 0  . simvastatin (ZOCOR) 80 MG tablet Take 40 mg by mouth every evening.      No current facility-administered medications on file prior to visit.     ***Review of Systems  Constitutional: Negative for malaise/fatigue and weight loss.  Respiratory: Negative for cough, hemoptysis and shortness of breath.   Cardiovascular: Positive for leg swelling (chronic). Negative for chest pain, palpitations and claudication.  Gastrointestinal: Negative for abdominal pain, blood in stool, constipation, heartburn and vomiting.  Genitourinary: Negative for dysuria.  Musculoskeletal: Positive for joint pain. Negative for myalgias.  Neurological: Negative for dizziness, focal weakness and headaches.  Endo/Heme/Allergies: Does not bruise/bleed easily.  Psychiatric/Behavioral: Negative for depression. The patient is not nervous/anxious.   All other systems reviewed and are negative.      Objective:  There were no vitals taken for this visit. There is no height or weight on file to calculate BMI.  ***Physical Exam  Constitutional: He appears well-developed and well-nourished. No distress.  Moderately Obese  HENT:  Head: Atraumatic.  Eyes: Conjunctivae are normal.  Neck: Neck supple. No JVD present. No thyromegaly present.  Cardiovascular: Normal rate, regular rhythm, normal heart sounds and intact distal pulses. Exam reveals no gallop.  No murmur heard. Pulmonary/Chest: Effort normal and breath sounds normal.  Abdominal: Soft. Bowel sounds are normal.  Musculoskeletal: Normal range of motion.        General: No edema.  Neurological: He is alert.  Skin: Skin is warm and dry.  Psychiatric: He has a normal mood and affect.    CARDIAC STUDIES:   EKG 08/15/2017: Normal sinus rhythm at the rate of 67 bpm, normal axis. No evidence of ischemia. PVCs. No significant change from EKG 08/07/2016.  Exercise myoview stress 04/28/2015: 1. The  resting electrocardiogram demonstrated normal sinus rhythm, normal resting conduction and no resting arrhythmias. The stress electrocardiogram was mildly positive. There was 1 mm ST depression in the inferior leads at peak exercsie which normalized at 2 minutes into recovery. occasional PVC. The patient performed treadmill exercise using a Bruce protocol, completing 3:36 minutes, 116% of the maximum predicted heart rate. Hypertensive BP, peak BP198/100 mmHg. Stress symptoms included dyspnea. Reduced aerobic tolerence. 2. Perfusion imaging study demonstrates mild apical thinning without ischemia or scar. Left ventricle systolic function Calculated by QGS was normal at 52% without regional wall motion abnormality. This represents a low risk study.  Echocardiogram 03/09/2015: Left ventricle cavity is normal in size. Mild decrease in LV systolic  function. Normal diastolic filling pattern. EF estimated at 45%. Right ventricle cavity is borderline dilated. Mildly reduced right ventricular function. IVC is dilated with respiratory variation. This suggests elevated right heart pressure.  Event Monitor 30 days 03/08/2015: No symptoms reported, Occasional PVC.  Heart cath 02/05/11: Proximal LAD 20-30% calcific and mid RCA 20% stenosis. Normal LVEF. Non crital CAD. 2007, 30-40% stenosis in the proximal to midsegment of the LAD.    Recent Labs:  ***  02/29/2016: cholesterol 137, triglycerides 100, HDL 50, LDL 67.  Creatinine 0.95, potassium 5.1, CMP otherwise normal.  08/30/2015: CBC normal, creatinine 0.89, potassium 4.9, total cholesterol 137, triglycerides 73, HDL 46, LDL 76, TSH 1.35   Assessment & Recommendations:   There are no diagnoses linked to this encounter.  Recommendation:   [name] is a elderly Caucasian male with history of known coronary artery disease, HTN, hyperlipidemia, coronary angiography in 2012 at revealed proximal LAD 50% stenosis and mild noncritical disease in other  vessels with normal LVEF. However he was evaluated for palpitations in 2016 an echocardiogram revealed mild decrease in EF of 45%, mild RV dilatation with mild RV systolic dysfunction. Nuclear stress test was nonischemic in December 2016. He now presents here for one year follow-up.  He has not had any angina pectoris or symptoms suggestive of congestive heart failure with worsening dyspnea. Physical examination is unchanged from prior examination a year. All pressure is also well controlled.  He continues to have bilateral lower extremity edema likely related to chronic venous insufficiency, I have reinforced diet modification and wearing support stockings regularly. Edema is stable at this time. We'll continue to monitor. Patient is not presently active, i have encouraged him to start daily walking. As patient is presently doing well, I'll have him follow-up in one year for reevaluation or sooner if problems present. No changes made to medications today.     Yates Decamp, MD, [facility] 08/13/2018, 5:24 PM [facility] Pager: [phone] Office: [phone] If no answer Cell [phone]

## 2018-08-14 ENCOUNTER — Ambulatory Visit: Payer: Self-pay | Admitting: Cardiology

## 2018-08-19 ENCOUNTER — Other Ambulatory Visit: Payer: Self-pay | Admitting: Cardiology

## 2018-12-16 DIAGNOSIS — Z0000 Encounter for general adult medical examination without abnormal findings: Secondary | ICD-10-CM | POA: Diagnosis not present

## 2018-12-16 DIAGNOSIS — E785 Hyperlipidemia, unspecified: Secondary | ICD-10-CM | POA: Diagnosis not present

## 2018-12-16 DIAGNOSIS — I10 Essential (primary) hypertension: Secondary | ICD-10-CM | POA: Diagnosis not present

## 2018-12-16 DIAGNOSIS — Z125 Encounter for screening for malignant neoplasm of prostate: Secondary | ICD-10-CM | POA: Diagnosis not present

## 2018-12-16 DIAGNOSIS — E039 Hypothyroidism, unspecified: Secondary | ICD-10-CM | POA: Diagnosis not present

## 2018-12-23 DIAGNOSIS — I2510 Atherosclerotic heart disease of native coronary artery without angina pectoris: Secondary | ICD-10-CM | POA: Diagnosis not present

## 2018-12-23 DIAGNOSIS — E7800 Pure hypercholesterolemia, unspecified: Secondary | ICD-10-CM | POA: Diagnosis not present

## 2018-12-23 DIAGNOSIS — I10 Essential (primary) hypertension: Secondary | ICD-10-CM | POA: Diagnosis not present

## 2018-12-23 DIAGNOSIS — R972 Elevated prostate specific antigen [PSA]: Secondary | ICD-10-CM | POA: Diagnosis not present

## 2018-12-29 ENCOUNTER — Encounter: Payer: Self-pay | Admitting: Cardiology

## 2018-12-29 DIAGNOSIS — R600 Localized edema: Secondary | ICD-10-CM | POA: Insufficient documentation

## 2018-12-29 DIAGNOSIS — R002 Palpitations: Secondary | ICD-10-CM

## 2018-12-29 DIAGNOSIS — I2510 Atherosclerotic heart disease of native coronary artery without angina pectoris: Secondary | ICD-10-CM

## 2018-12-29 HISTORY — DX: Atherosclerotic heart disease of native coronary artery without angina pectoris: I25.10

## 2018-12-29 HISTORY — DX: Palpitations: R00.2

## 2018-12-29 HISTORY — DX: Localized edema: R60.0

## 2018-12-30 ENCOUNTER — Ambulatory Visit (INDEPENDENT_AMBULATORY_CARE_PROVIDER_SITE_OTHER): Payer: PPO | Admitting: Cardiology

## 2018-12-30 ENCOUNTER — Telehealth: Payer: Self-pay

## 2018-12-30 ENCOUNTER — Other Ambulatory Visit: Payer: Self-pay

## 2018-12-30 ENCOUNTER — Encounter: Payer: Self-pay | Admitting: Cardiology

## 2018-12-30 VITALS — BP 113/62 | HR 70 | Ht 72.0 in | Wt 260.0 lb

## 2018-12-30 DIAGNOSIS — R600 Localized edema: Secondary | ICD-10-CM | POA: Diagnosis not present

## 2018-12-30 DIAGNOSIS — R0609 Other forms of dyspnea: Secondary | ICD-10-CM | POA: Diagnosis not present

## 2018-12-30 DIAGNOSIS — I2510 Atherosclerotic heart disease of native coronary artery without angina pectoris: Secondary | ICD-10-CM

## 2018-12-30 DIAGNOSIS — E668 Other obesity: Secondary | ICD-10-CM | POA: Diagnosis not present

## 2018-12-30 MED ORDER — ASPIRIN EC 81 MG PO TBEC: 81.0000 mg | DELAYED_RELEASE_TABLET | Freq: Every day | ORAL | 1 refills | Status: DC

## 2018-12-30 NOTE — Progress Notes (Signed)
Primary Physician:  Pearson GrippeKim, James, MD   Patient ID: [name], male    DOB: [birth year], 85 y.o.   MRN: [id number]  Subjective:    Chief Complaint  Patient presents with  . Coronary Artery Disease    1 year f/u    HPI: Victor BongoHubert C Markowicz  is a 85 y.o. male  with  history of known coronary artery disease, coronary angiography in 2012 at revealed proximal LAD 50% stenosis and mild noncritical disease in other vessels with normal LVEF. However he was evaluated for palpitations in 2016 an echocardiogram revealed mild decrease in EF of 45%, mild RV dilatation with mild RV systolic dysfunction. Nuclear stress test was nonischemic in December 2016. He now presents here for one year follow-up.  His past medical history significant for hyperlipidemia, chronic palpitations, bilateral leg edema. He is presently doing well and no specific complaints today except for arthritis of his knee that limits his activity. He has not been exercising. He denies any palpitations or chest pain. Has dyspnea on exertion if he over exerts himself. Tolerating medications well.   Past Medical History:  Diagnosis Date  . Arthritis    STATES HIS SHOULDERS ARE "FROZEN" - VERY LIMITIED RANGE OF MOTION; OA BOTH KNEES  . Atherosclerosis of native coronary artery of native heart without angina pectoris 12/29/2018  . Bilateral edema of lower extremity 12/29/2018  . Coronary artery disease    "MILD NONCRITICAL CORONARY ARTERY DISEASE " - PER OFFICE NOTE DR. Jacinto HalimGANJI 02/27/12 - PT NO LONGER HAS TO SEE CARDIOLGIST UNLESS HE HAS A NEED.  Marland Kitchen. Hx: UTI (urinary tract infection)   . Hyperlipidemia   . Palpitations 12/29/2018  . Psoriasis     Past Surgical History:  Procedure Laterality Date  . CARDIAC CATHETERIZATION  02-05-11  . TONSILLECTOMY     AGE 72  . TOTAL KNEE ARTHROPLASTY Left 10/04/2013   Procedure: LEFT TOTAL KNEE ARTHROPLASTY;  Surgeon: Loanne DrillingFrank V Aluisio, MD;  Location: [facility];  Service: Orthopedics;  Laterality: Left;  .  TOTAL KNEE ARTHROPLASTY Right 10/31/2014   Procedure: RIGHT TOTAL KNEE ARTHROPLASTY;  Surgeon: Ollen GrossFrank Aluisio, MD;  Location: [facility];  Service: Orthopedics;  Laterality: Right;    Social History   Socioeconomic History  . Marital status: Married    Spouse name: Not on file  . Number of children: 2  . Years of education: Not on file  . Highest education level: Not on file  Occupational History  . Not on file  Social Needs  . Financial resource strain: Not on file  . Food insecurity    Worry: Not on file    Inability: Not on file  . Transportation needs    Medical: Not on file    Non-medical: Not on file  Tobacco Use  . Smoking status: Former Smoker    Years: 20.00    Types: Cigarettes, Pipe, Cigars    Quit date: 10/26/1983    Years since quitting: 35.2  . Smokeless tobacco: Never Used  Substance and Sexual Activity  . Alcohol use: Yes    Comment:   RARE ALCOHOL  . Drug use: No  . Sexual activity: Not on file  Lifestyle  . Physical activity    Days per week: Not on file    Minutes per session: Not on file  . Stress: Not on file  Relationships  . Social Musicianconnections    Talks on phone: Not on file    Gets together: Not on file  Attends religious service: Not on file    Active member of club or organization: Not on file    Attends meetings of clubs or organizations: Not on file    Relationship status: Not on file  . Intimate partner violence    Fear of current or ex partner: Not on file    Emotionally abused: Not on file    Physically abused: Not on file    Forced sexual activity: Not on file  Other Topics Concern  . Not on file  Social History Narrative  . Not on file    Review of Systems  Constitution: Negative for decreased appetite, malaise/fatigue, weight gain and weight loss.  Eyes: Negative for visual disturbance.  Cardiovascular: Positive for dyspnea on exertion and leg swelling (chronic). Negative for chest pain, claudication, orthopnea, palpitations and  syncope.  Respiratory: Negative for hemoptysis and wheezing.   Endocrine: Negative for cold intolerance and heat intolerance.  Hematologic/Lymphatic: Does not bruise/bleed easily.  Skin: Negative for nail changes.  Musculoskeletal: Positive for joint pain (bilateral knees). Negative for muscle weakness and myalgias.  Gastrointestinal: Negative for abdominal pain, change in bowel habit, nausea and vomiting.  Neurological: Negative for difficulty with concentration, dizziness, focal weakness and headaches.  Psychiatric/Behavioral: Negative for altered mental status and suicidal ideas.  All other systems reviewed and are negative.     Objective:  Blood pressure 113/62, pulse 70, height 6' (1.829 m), weight 260 lb (117.9 kg). Body mass index is 35.26 kg/m.    Physical Exam  Constitutional: He is oriented to person, place, and time. Vital signs are normal. He appears well-developed and well-nourished.  HENT:  Head: Normocephalic and atraumatic.  Neck: Normal range of motion.  Cardiovascular: Normal rate, regular rhythm, normal heart sounds and intact distal pulses.  1+ leg edema  Pulmonary/Chest: Effort normal and breath sounds normal. No accessory muscle usage. No respiratory distress.  Abdominal: Soft. Bowel sounds are normal.  Musculoskeletal: Normal range of motion.  Neurological: He is alert and oriented to person, place, and time.  Skin: Skin is warm and dry.  Vitals reviewed.  Radiology: No results found.  Laboratory examination:   12/16/2018: CBC normal. Creatinine 0.86, potassium 4.4, CMP normal. Cholesterol 127, triglycerides 79, HDL 46, LDL 65. TSH normal.   CMP Latest Ref Rng & Units 11/02/2014 11/01/2014 10/26/2014  Glucose 65 - 99 mg/dL 104(H) 147(H) 99  BUN 6 - 20 mg/dL 19 17 16   Creatinine 0.61 - 1.24 mg/dL 0.72 0.95 0.95  Sodium 135 - 145 mmol/L 136 140 144  Potassium 3.5 - 5.1 mmol/L 4.0 4.5 5.2(H)  Chloride 101 - 111 mmol/L 104 104 108  CO2 22 - 32 mmol/L 23 25 27    Calcium 8.9 - 10.3 mg/dL 8.3(L) 8.9 9.4  Total Protein 6.5 - 8.1 g/dL - - 7.7  Total Bilirubin 0.3 - 1.2 mg/dL - - 0.8  Alkaline Phos 38 - 126 U/L - - 61  AST 15 - 41 U/L - - 24  ALT 17 - 63 U/L - - 25   CBC Latest Ref Rng & Units 11/02/2014 11/01/2014 10/26/2014  WBC 4.0 - 10.5 K/uL 14.4(H) 16.2(H) 5.4  Hemoglobin 13.0 - 17.0 g/dL 11.8(L) 12.6(L) 13.8  Hematocrit 39.0 - 52.0 % 35.9(L) 38.4(L) 42.3  Platelets 150 - 400 K/uL 154 177 195   Lipid Panel  No results found for: CHOL, TRIG, HDL, CHOLHDL, VLDL, LDLCALC, LDLDIRECT HEMOGLOBIN A1C No results found for: HGBA1C, MPG TSH No results for input(s): TSH in the last  8760 hours.  PRN Meds:. Medications Discontinued During This Encounter  Medication Reason  . aspirin EC 81 MG tablet Error  . rivaroxaban (XARELTO) 10 MG TABS tablet Error   Current Meds  Medication Sig  . Adalimumab (HUMIRA PEDIATRIC CROHNS START) 40 MG/0.8ML PSKT Inject into the skin.  . cholecalciferol (VITAMIN D3) 25 MCG (1000 UT) tablet Take 1,000 Units by mouth daily.  . Coenzyme Q10 (COQ10) 100 MG CAPS Take 1 capsule by mouth daily.  . metoprolol succinate (TOPROL-XL) 25 MG 24 hr tablet TAKE 1/2 TABLET DAILY  . Multiple Vitamin (MULTIVITAMIN WITH MINERALS) TABS tablet Take 1 tablet by mouth daily.  . Omega-3 Fatty Acids (FISH OIL PO) Take 1 capsule by mouth at bedtime.  . simvastatin (ZOCOR) 80 MG tablet Take 40 mg by mouth every evening.   . tamsulosin (FLOMAX) 0.4 MG CAPS capsule Take 0.4 mg by mouth.    Cardiac Studies:   Exercise myoview stress 04/28/2015: 1. The resting electrocardiogram demonstrated normal sinus rhythm, normal resting conduction and no resting arrhythmias. The stress electrocardiogram was mildly positive. There was 1 mm ST depression in the inferior leads at peak exercsie which normalized at 2 minutes into recovery. occasional PVC. The patient performed treadmill exercise using a Bruce protocol, completing 3:36 minutes, 116% of the maximum  predicted heart rate. Hypertensive BP, peak BP198/100 mmHg. Stress symptoms included dyspnea. Reduced aerobic tolerence. 2. Perfusion imaging study demonstrates mild apical thinning without ischemia or scar. Left ventricle systolic function Calculated by QGS was normal at 52% without regional wall motion abnormality. This represents a low risk study.  Echocardiogram 03/09/2015: Left ventricle cavity is normal in size. Mild decrease in LV systolic function. Normal diastolic filling pattern. EF estimated at 45%. Right ventricle cavity is borderline dilated. Mildly reduced right ventricular function. IVC is dilated with respiratory variation. This suggests elevated right heart pressure.  Event Monitor 30 days 03/08/2015: No symptoms reported, Occasional PVC.  Heart cath 02/05/11 Proximal LAD 20-30% calcific and mid RCA 20% stenosis. Normal LVEF. Non crital CAD. 2007, 30-40% stenosis in the proximal to midsegment of the LAD.   Assessment:     ICD-10-CM   1. Atherosclerosis of native coronary artery of native heart without angina pectoris  I25.10 EKG 12-Lead  2. Palpitations  R00.2 EKG 12-Lead  3. Bilateral edema of lower extremity  R60.0     EKG 12/30/2018: Normal sinus rhythm at 67 bpm, normal axis, diffuse non specific T wave flattening.   Recommendations:   Patient is here for 1 year follow-up visit for CAD.  He is presently doing well without any complaints today.  I reviewed recent labs from PCP office, labs are stable.  Lipids are very well controlled.  He is on appropriate medical therapy.  Blood pressure is stable.  Physical exam is unremarkable.  He does have 1+ pitting edema that is chronic for him and has remained stable with use of support stockings.  I'm overall impressed with his physical status given his advanced age.  We have discussed diet modifications to promote weight loss as well as to starting 20 minutes of exercise daily.  We will continue with annual follow-ups, encouraged  him to contact us sooner if needed.  He was found to not be taking aspirin, but encouraged him to resume this.  There is about check indications Aspirin therapy for him.  Toniann FailAshton Haynes Seferino Oscar, MSN, APRN, FNP-C [facility] Office: [phone] Fax: [phone]

## 2018-12-30 NOTE — Telephone Encounter (Signed)
Should this patient have nitroglycerin?

## 2018-12-31 NOTE — Telephone Encounter (Signed)
He can have some just in case he needs it given his CAD history, but he hasn't had any chest pain in a long time. I can send him some in

## 2018-12-31 NOTE — Telephone Encounter (Signed)
Forwarding to AK. AK you may want to update your note, no chief complaints

## 2019-01-03 ENCOUNTER — Encounter: Payer: Self-pay | Admitting: Cardiology

## 2019-01-25 ENCOUNTER — Other Ambulatory Visit: Payer: Self-pay | Admitting: Cardiology

## 2019-02-08 DIAGNOSIS — R972 Elevated prostate specific antigen [PSA]: Secondary | ICD-10-CM | POA: Diagnosis not present

## 2019-02-15 DIAGNOSIS — R3915 Urgency of urination: Secondary | ICD-10-CM | POA: Diagnosis not present

## 2019-02-15 DIAGNOSIS — N401 Enlarged prostate with lower urinary tract symptoms: Secondary | ICD-10-CM | POA: Diagnosis not present

## 2019-02-15 DIAGNOSIS — N3943 Post-void dribbling: Secondary | ICD-10-CM | POA: Diagnosis not present

## 2019-02-17 ENCOUNTER — Other Ambulatory Visit: Payer: Self-pay | Admitting: Cardiology

## 2019-02-26 DIAGNOSIS — H6123 Impacted cerumen, bilateral: Secondary | ICD-10-CM | POA: Diagnosis not present

## 2019-02-26 DIAGNOSIS — Z87891 Personal history of nicotine dependence: Secondary | ICD-10-CM | POA: Diagnosis not present

## 2019-03-20 ENCOUNTER — Other Ambulatory Visit: Payer: Self-pay | Admitting: Cardiology

## 2019-03-29 DIAGNOSIS — L03114 Cellulitis of left upper limb: Secondary | ICD-10-CM | POA: Diagnosis not present

## 2019-03-31 DIAGNOSIS — L03114 Cellulitis of left upper limb: Secondary | ICD-10-CM | POA: Diagnosis not present

## 2019-04-18 ENCOUNTER — Other Ambulatory Visit: Payer: Self-pay | Admitting: Cardiology

## 2019-05-02 ENCOUNTER — Other Ambulatory Visit: Payer: Self-pay | Admitting: Cardiology

## 2019-06-17 DIAGNOSIS — R972 Elevated prostate specific antigen [PSA]: Secondary | ICD-10-CM | POA: Diagnosis not present

## 2019-06-17 DIAGNOSIS — I10 Essential (primary) hypertension: Secondary | ICD-10-CM | POA: Diagnosis not present

## 2019-06-24 DIAGNOSIS — E785 Hyperlipidemia, unspecified: Secondary | ICD-10-CM | POA: Diagnosis not present

## 2019-06-24 DIAGNOSIS — I2510 Atherosclerotic heart disease of native coronary artery without angina pectoris: Secondary | ICD-10-CM | POA: Diagnosis not present

## 2019-06-24 DIAGNOSIS — R972 Elevated prostate specific antigen [PSA]: Secondary | ICD-10-CM | POA: Diagnosis not present

## 2019-06-24 DIAGNOSIS — I10 Essential (primary) hypertension: Secondary | ICD-10-CM | POA: Diagnosis not present

## 2019-07-10 ENCOUNTER — Ambulatory Visit: Payer: PPO | Attending: Internal Medicine

## 2019-07-10 DIAGNOSIS — Z23 Encounter for immunization: Secondary | ICD-10-CM | POA: Insufficient documentation

## 2019-07-10 NOTE — Progress Notes (Signed)
   Covid-19 Vaccination Clinic  Name:  RONDALL RADIGAN    MRN: [id number] DOB: 1928/03/10  07/10/2019  Mr. Kimbrell was observed post Covid-19 immunization for 15 minutes without incidence. He was provided with Vaccine Information Sheet and instruction to access the V-Safe system.   Mr. Rigel was instructed to call 911 with any severe reactions post vaccine: Marland Kitchen Difficulty breathing  . Swelling of your face and throat  . A fast heartbeat  . A bad rash all over your body  . Dizziness and weakness    Immunizations Administered    Name Date Dose VIS Date Route   Pfizer COVID-19 Vaccine 07/10/2019 10:42 AM 0.3 mL 05/07/2019 Intramuscular   Manufacturer: [employer], [employer]   Lot: [id number]   NDC: [address]-3

## 2019-08-02 ENCOUNTER — Ambulatory Visit: Payer: PPO | Attending: Internal Medicine

## 2019-08-02 DIAGNOSIS — Z23 Encounter for immunization: Secondary | ICD-10-CM | POA: Insufficient documentation

## 2019-08-02 NOTE — Progress Notes (Signed)
   Covid-19 Vaccination Clinic  Name:  [name]    MRN: [id number] DOB: Mar 31, 1928  08/02/2019  [name] was observed post Covid-19 immunization for 15 minutes without incident. He was provided with Vaccine Information Sheet and instruction to access the V-Safe system.   Mr. Sides was instructed to call 911 with any severe reactions post vaccine: Marland Kitchen Difficulty breathing  . Swelling of face and throat  . A fast heartbeat  . A bad rash all over body  . Dizziness and weakness   Immunizations Administered    Name Date Dose VIS Date Route   Pfizer COVID-19 Vaccine 08/02/2019 10:56 AM 0.3 mL 05/07/2019 Intramuscular   Manufacturer: [employer], [employer]   Lot: [id number]   NDC: [address]-5

## 2019-09-08 ENCOUNTER — Other Ambulatory Visit: Payer: Self-pay | Admitting: Cardiology

## 2019-10-28 ENCOUNTER — Other Ambulatory Visit: Payer: Self-pay | Admitting: Cardiology

## 2019-12-16 DIAGNOSIS — R972 Elevated prostate specific antigen [PSA]: Secondary | ICD-10-CM | POA: Diagnosis not present

## 2019-12-16 DIAGNOSIS — I10 Essential (primary) hypertension: Secondary | ICD-10-CM | POA: Diagnosis not present

## 2019-12-30 ENCOUNTER — Ambulatory Visit: Payer: PPO | Admitting: Cardiology

## 2019-12-31 DIAGNOSIS — I10 Essential (primary) hypertension: Secondary | ICD-10-CM | POA: Diagnosis not present

## 2019-12-31 DIAGNOSIS — L409 Psoriasis, unspecified: Secondary | ICD-10-CM | POA: Diagnosis not present

## 2019-12-31 DIAGNOSIS — E039 Hypothyroidism, unspecified: Secondary | ICD-10-CM | POA: Diagnosis not present

## 2019-12-31 DIAGNOSIS — E785 Hyperlipidemia, unspecified: Secondary | ICD-10-CM | POA: Diagnosis not present

## 2019-12-31 DIAGNOSIS — I2510 Atherosclerotic heart disease of native coronary artery without angina pectoris: Secondary | ICD-10-CM | POA: Diagnosis not present

## 2019-12-31 DIAGNOSIS — R972 Elevated prostate specific antigen [PSA]: Secondary | ICD-10-CM | POA: Diagnosis not present

## 2019-12-31 DIAGNOSIS — Z0000 Encounter for general adult medical examination without abnormal findings: Secondary | ICD-10-CM | POA: Diagnosis not present

## 2019-12-31 DIAGNOSIS — R002 Palpitations: Secondary | ICD-10-CM | POA: Diagnosis not present

## 2020-01-06 ENCOUNTER — Other Ambulatory Visit: Payer: Self-pay

## 2020-01-06 ENCOUNTER — Ambulatory Visit: Payer: PPO | Admitting: Cardiology

## 2020-01-06 ENCOUNTER — Encounter: Payer: Self-pay | Admitting: Cardiology

## 2020-01-06 VITALS — BP 130/78 | HR 69 | Resp 16 | Ht 72.0 in | Wt 255.0 lb

## 2020-01-06 DIAGNOSIS — R0609 Other forms of dyspnea: Secondary | ICD-10-CM

## 2020-01-06 DIAGNOSIS — I2510 Atherosclerotic heart disease of native coronary artery without angina pectoris: Secondary | ICD-10-CM

## 2020-01-06 DIAGNOSIS — E668 Other obesity: Secondary | ICD-10-CM

## 2020-01-06 NOTE — Progress Notes (Signed)
Primary Physician/Referring:  Jani Gravel, MD  Patient ID: [name], male    DOB: Feb 13, 1928, 86 y.o.   MRN: [id number]  Chief Complaint  Patient presents with  . Coronary Artery Disease  . Leg Swelling  . Follow-up    1 year   HPI:    [name]  is a 86 y.o. male  with  history of known coronary artery disease, coronary angiography in 2012 at revealed proximal LAD 50% stenosis and mild noncritical disease in other vessels with normal LVEF. However he was evaluated for palpitations in 2016 an echocardiogram revealed mild decrease in EF of 45%, mild RV dilatation with mild RV systolic dysfunction. Nuclear stress test was nonischemic in December 2016. He now presents here for one year follow-up.  His past medical history significant for hyperlipidemia. He is presently doing well and no specific complaints today. Tolerating medications well.   Past Medical History:  Diagnosis Date  . Arthritis    STATES HIS SHOULDERS ARE "FROZEN" - VERY LIMITIED RANGE OF MOTION; OA BOTH KNEES  . Atherosclerosis of native coronary artery of native heart without angina pectoris 12/29/2018  . Bilateral edema of lower extremity 12/29/2018  . Coronary artery disease    "MILD NONCRITICAL CORONARY ARTERY DISEASE " - PER OFFICE NOTE DR. Einar Gip 02/27/12 - PT NO LONGER HAS TO SEE CARDIOLGIST UNLESS HE HAS A NEED.  Marland Kitchen Hx: UTI (urinary tract infection)   . Hyperlipidemia   . Palpitations 12/29/2018  . Psoriasis    Past Surgical History:  Procedure Laterality Date  . CARDIAC CATHETERIZATION  02-05-11  . TONSILLECTOMY     AGE 89  . TOTAL KNEE ARTHROPLASTY Left 10/04/2013   Procedure: LEFT TOTAL KNEE ARTHROPLASTY;  Surgeon: Gearlean Alf, MD;  Location: [facility];  Service: Orthopedics;  Laterality: Left;  . TOTAL KNEE ARTHROPLASTY Right 10/31/2014   Procedure: RIGHT TOTAL KNEE ARTHROPLASTY;  Surgeon: Gaynelle Arabian, MD;  Location: [facility];  Service: Orthopedics;  Laterality: Right;   History reviewed. No  pertinent family history.  Social History   Tobacco Use  . Smoking status: Former Smoker    Years: 20.00    Types: Cigarettes, Pipe, Cigars    Quit date: 10/26/1983    Years since quitting: 36.2  . Smokeless tobacco: Never Used  Substance Use Topics  . Alcohol use: Yes    Comment:   RARE ALCOHOL   Marital Status: Married  ROS  Review of Systems  Cardiovascular: Positive for dyspnea on exertion. Negative for chest pain and leg swelling.  Gastrointestinal: Negative for melena.   Objective  Blood pressure 130/78, pulse 69, resp. rate 16, height 6' (1.829 m), weight 255 lb (115.7 kg), SpO2 96 %.  Vitals with BMI 01/06/2020 12/30/2018 08/29/2016  Height _0  _1  _2   Weight 255 lbs 260 lbs 250 lbs  BMI 40.98 11.91 34  Systolic 478 295 621  Diastolic 78 62 71  Pulse 69 70 70     Physical Exam Constitutional:      Appearance: He is obese.  Cardiovascular:     Rate and Rhythm: Normal rate and regular rhythm.     Pulses:          Carotid pulses are 2+ on the right side and 2+ on the left side.      Radial pulses are 2+ on the right side and 2+ on the left side.       Popliteal pulses are 2+ on the right side  and 2+ on the left side.       Dorsalis pedis pulses are 1+ on the right side and 2+ on the left side.       Posterior tibial pulses are 0 on the right side and 0 on the left side.     Heart sounds: Normal heart sounds. No murmur heard.  No gallop.      Comments: Trace leg edema, no JVD. Pulmonary:     Effort: Pulmonary effort is normal.     Breath sounds: Normal breath sounds.  Abdominal:     General: Bowel sounds are normal.     Palpations: Abdomen is soft.     Comments: obese    Laboratory examination:   External labs:   12/16/2019: HDL 51.000, LDL-C 68.000, Cholesterol, total 132.000, Triglycerides 63.000, TSH 1.610, PSA 7.400, eGFR 77.  BUN 17, creatinine 0.87.  CMP otherwise normal.  Hb 13.3/HCT 39.7, platelets 156.  06/17/2019: Glucose Random 84.000,  Creatinine, Serum 0.870, BUN 17.000 MG     12/16/2018: CBC normal. Creatinine 0.86, potassium 4.4, CMP normal. Cholesterol 127, triglycerides 79, HDL 46, LDL 65. TSH normal.   Medications and allergies   Allergies  Allergen Reactions  . Oxycodone Other (See Comments)    Couldn't talk. Thought he'd had a stroke     Outpatient Medications Prior to Visit  Medication Sig Dispense Refill  . Adalimumab (HUMIRA PEDIATRIC CROHNS START) 40 MG/0.8ML PSKT Inject into the skin.    Marland Kitchen aspirin 81 MG EC tablet TAKE 1 TABLET BY MOUTH EVERY DAY 90 tablet 3  . cholecalciferol (VITAMIN D3) 25 MCG (1000 UT) tablet Take 1,000 Units by mouth daily.    . Coenzyme Q10 (COQ10) 100 MG CAPS Take 1 capsule by mouth daily.    . metoprolol succinate (TOPROL-XL) 25 MG 24 hr tablet TAKE 1/2 TABLET BY MOUTH EVERY DAY 45 tablet 5  . Multiple Vitamin (MULTIVITAMIN WITH MINERALS) TABS tablet Take 1 tablet by mouth daily.    . Omega-3 Fatty Acids (FISH OIL PO) Take 1 capsule by mouth at bedtime.    . simvastatin (ZOCOR) 80 MG tablet Take 40 mg by mouth every evening.     . tamsulosin (FLOMAX) 0.4 MG CAPS capsule Take 0.4 mg by mouth.    . nitroGLYCERIN (NITROSTAT) 0.4 MG SL tablet Place 0.4 mg under the tongue every 5 (five) minutes as needed for chest pain.     No facility-administered medications prior to visit.   Radiology:   No results found.  Cardiac Studies:   Exercise myoview stress 04/28/2015: 1. The resting electrocardiogram demonstrated normal sinus rhythm, normal resting conduction and no resting arrhythmias. The stress electrocardiogram was mildly positive. There was 1 mm ST depression in the inferior leads at peak exercsie which normalized at 2 minutes into recovery. occasional PVC. The patient performed treadmill exercise using a Bruce protocol, completing 3:36 minutes, 116% of the maximum predicted heart rate. Hypertensive BP, peak BP198/100 mmHg. Stress symptoms included dyspnea. Reduced aerobic  tolerence. 2. Perfusion imaging study demonstrates mild apical thinning without ischemia or scar. Left ventricle systolic function Calculated by QGS was normal at 52% without regional wall motion abnormality. This represents a low risk study.  Echocardiogram 03/09/2015: Left ventricle cavity is normal in size. Mild decrease in LV systolic function. Normal diastolic filling pattern. EF estimated at 45%. Right ventricle cavity is borderline dilated. Mildly reduced right ventricular function. IVC is dilated with respiratory variation. This suggests elevated right heart pressure.  Event Monitor 30 days  03/08/2015: No symptoms reported, Occasional PVC.  Heart cath 02/05/11 Proximal LAD 20-30% calcific and mid RCA 20% stenosis. Normal LVEF. Non crital CAD. 2007, 30-40% stenosis in the proximal to midsegment of the LAD.  EKG  EKG 01/06/2020: Sinus rhythm with first-degree AV block at the rate of 73 bpm, left atrial enlargement enlargement, normal axis.  Poor R wave progression, probably normal variant cannot exclude anteroseptal infarct old.  Single PAC.     12/30/2018: Normal sinus rhythm at 67 bpm, normal axis, diffuse non specific T wave flattening.   Assessment     ICD-10-CM   1. Atherosclerosis of native coronary artery of native heart without angina pectoris  I25.10 EKG 12-Lead  2. Dyspnea on exertion  R06.00   3. Moderate obesity  E66.8      Medications Discontinued During This Encounter  Medication Reason  . nitroGLYCERIN (NITROSTAT) 0.4 MG SL tablet Patient Preference    No orders of the defined types were placed in this encounter.   Recommendations:   [name] is a 86 y.o. male  with  history of hypertension, hyperlipidemia and coronary artery disease, coronary angiography in 2012 at revealed proximal LAD 50% stenosis and mild noncritical disease in other vessels with normal LVEF. However he was evaluated for palpitations in 2016 an echocardiogram revealed mild decrease in  EF of 45%, mild RV dilatation with mild RV systolic dysfunction. Nuclear stress test was nonischemic in December 2016. He now presents here for one year follow-up, likes to see me on annual basis.  He is presently doing well except for mild chronic dyspnea, leg edema is very minimal, he has not had any angina, no dizziness or syncope.  He is tolerating low-dose of beta-blocker without any complications.  He still continues to remain active.  No clinical evidence of heart failure.  No significant change in the EKG.  I reviewed his labs from the PCP, lipids are under excellent control, renal function is normal and CBC is within normal limits.  No changes in the medications were done by me today.  I will see him back on annual basis.  "Happy birthday".   Adrian Prows, MD, [facility] 01/06/2020, 4:37 PM Office: [phone]

## 2020-02-02 DIAGNOSIS — S76011A Strain of muscle, fascia and tendon of right hip, initial encounter: Secondary | ICD-10-CM | POA: Diagnosis not present

## 2020-02-02 DIAGNOSIS — M25551 Pain in right hip: Secondary | ICD-10-CM | POA: Diagnosis not present

## 2020-02-02 DIAGNOSIS — S7011XA Contusion of right thigh, initial encounter: Secondary | ICD-10-CM | POA: Diagnosis not present

## 2020-02-08 ENCOUNTER — Ambulatory Visit: Payer: PPO | Attending: Internal Medicine

## 2020-02-08 DIAGNOSIS — Z23 Encounter for immunization: Secondary | ICD-10-CM

## 2020-02-08 NOTE — Progress Notes (Signed)
   Covid-19 Vaccination Clinic  Name:  TRASEAN DELIMA    MRN: [id number] DOB: [birth year]  02/08/2020  Mr. Corrales was observed post Covid-19 immunization for 15 minutes without incident. He was provided with Vaccine Information Sheet and instruction to access the V-Safe system.   Mr. Chisolm was instructed to call 911 with any severe reactions post vaccine: Marland Kitchen Difficulty breathing  . Swelling of face and throat  . A fast heartbeat  . A bad rash all over body  . Dizziness and weakness

## 2020-02-16 DIAGNOSIS — M25551 Pain in right hip: Secondary | ICD-10-CM | POA: Diagnosis not present

## 2020-02-25 DIAGNOSIS — R8279 Other abnormal findings on microbiological examination of urine: Secondary | ICD-10-CM | POA: Diagnosis not present

## 2020-02-25 DIAGNOSIS — R972 Elevated prostate specific antigen [PSA]: Secondary | ICD-10-CM | POA: Diagnosis not present

## 2020-02-25 DIAGNOSIS — N401 Enlarged prostate with lower urinary tract symptoms: Secondary | ICD-10-CM | POA: Diagnosis not present

## 2020-02-25 DIAGNOSIS — R3915 Urgency of urination: Secondary | ICD-10-CM | POA: Diagnosis not present

## 2020-05-21 DIAGNOSIS — N390 Urinary tract infection, site not specified: Secondary | ICD-10-CM | POA: Diagnosis not present

## 2020-05-21 DIAGNOSIS — Z20822 Contact with and (suspected) exposure to covid-19: Secondary | ICD-10-CM | POA: Diagnosis not present

## 2020-05-21 DIAGNOSIS — R509 Fever, unspecified: Secondary | ICD-10-CM | POA: Diagnosis not present

## 2020-06-30 DIAGNOSIS — H6123 Impacted cerumen, bilateral: Secondary | ICD-10-CM | POA: Diagnosis not present

## 2020-09-28 ENCOUNTER — Other Ambulatory Visit: Payer: Self-pay | Admitting: Cardiology

## 2020-10-18 ENCOUNTER — Other Ambulatory Visit: Payer: Self-pay | Admitting: Cardiology

## 2020-12-28 DIAGNOSIS — H6041 Cholesteatoma of right external ear: Secondary | ICD-10-CM | POA: Diagnosis not present

## 2020-12-28 DIAGNOSIS — H938X1 Other specified disorders of right ear: Secondary | ICD-10-CM | POA: Diagnosis not present

## 2021-01-05 ENCOUNTER — Ambulatory Visit: Payer: PPO | Admitting: Cardiology

## 2021-04-30 DIAGNOSIS — H6041 Cholesteatoma of right external ear: Secondary | ICD-10-CM | POA: Diagnosis not present

## 2021-11-03 ENCOUNTER — Emergency Department (HOSPITAL_BASED_OUTPATIENT_CLINIC_OR_DEPARTMENT_OTHER)
Admission: EM | Admit: 2021-11-03 | Discharge: 2021-11-03 | Disposition: A | Discharge status: Home / Self Care | Payer: PPO | Attending: Emergency Medicine | Admitting: Emergency Medicine

## 2021-11-03 ENCOUNTER — Other Ambulatory Visit: Payer: Self-pay

## 2021-11-03 ENCOUNTER — Emergency Department (HOSPITAL_BASED_OUTPATIENT_CLINIC_OR_DEPARTMENT_OTHER): Payer: PPO

## 2021-11-03 ENCOUNTER — Encounter (HOSPITAL_BASED_OUTPATIENT_CLINIC_OR_DEPARTMENT_OTHER): Payer: Self-pay

## 2021-11-03 DIAGNOSIS — J029 Acute pharyngitis, unspecified: Secondary | ICD-10-CM | POA: Diagnosis not present

## 2021-11-03 DIAGNOSIS — Z20822 Contact with and (suspected) exposure to covid-19: Secondary | ICD-10-CM | POA: Insufficient documentation

## 2021-11-03 DIAGNOSIS — Z7982 Long term (current) use of aspirin: Secondary | ICD-10-CM | POA: Insufficient documentation

## 2021-11-03 DIAGNOSIS — R410 Disorientation, unspecified: Secondary | ICD-10-CM | POA: Diagnosis not present

## 2021-11-03 DIAGNOSIS — Z79899 Other long term (current) drug therapy: Secondary | ICD-10-CM | POA: Diagnosis not present

## 2021-11-03 DIAGNOSIS — R5381 Other malaise: Secondary | ICD-10-CM | POA: Diagnosis not present

## 2021-11-03 DIAGNOSIS — R509 Fever, unspecified: Secondary | ICD-10-CM | POA: Diagnosis present

## 2021-11-03 DIAGNOSIS — R5383 Other fatigue: Secondary | ICD-10-CM | POA: Insufficient documentation

## 2021-11-03 DIAGNOSIS — J209 Acute bronchitis, unspecified: Secondary | ICD-10-CM | POA: Insufficient documentation

## 2021-11-03 LAB — BASIC METABOLIC PANEL
Anion gap: 11 (ref 5–15)
BUN: 12 mg/dL (ref 8–23)
CO2: 21 mmol/L — ABNORMAL LOW (ref 22–32)
Calcium: 9.2 mg/dL (ref 8.9–10.3)
Chloride: 105 mmol/L (ref 98–111)
Creatinine, Ser: 0.89 mg/dL (ref 0.61–1.24)
GFR, Estimated: >60 mL/min (ref >60)
Glucose, Bld: 104 mg/dL — ABNORMAL HIGH (ref 70–99)
Potassium: 3.7 mmol/L (ref 3.5–5.1)
Sodium: 137 mmol/L (ref 135–145)

## 2021-11-03 LAB — LACTIC ACID, PLASMA: Lactic Acid, Venous: 1.5 mmol/L (ref 0.5–1.9)

## 2021-11-03 LAB — URINALYSIS, ROUTINE W REFLEX MICROSCOPIC
Bilirubin Urine: NEGATIVE
Glucose, UA: NEGATIVE mg/dL
Ketones, ur: NEGATIVE mg/dL
Leukocytes,Ua: NEGATIVE
Nitrite: NEGATIVE
Specific Gravity, Urine: 1.027 (ref 1.005–1.030)
pH: 5 (ref 5.0–8.0)

## 2021-11-03 LAB — CBC WITH DIFFERENTIAL/PLATELET
Abs Immature Granulocytes: 0.04 10*3/uL (ref 0.00–0.07)
Basophils Absolute: 0.1 10*3/uL (ref 0.0–0.1)
Basophils Relative: 1 %
Eosinophils Absolute: 0.3 10*3/uL (ref 0.0–0.5)
Eosinophils Relative: 3 %
HCT: 40.3 % (ref 39.0–52.0)
Hemoglobin: 13.5 g/dL (ref 13.0–17.0)
Immature Granulocytes: 1 %
Lymphocytes Relative: 17 %
Lymphs Abs: 1.4 10*3/uL (ref 0.7–4.0)
MCH: 31 pg (ref 26.0–34.0)
MCHC: 33.5 g/dL (ref 30.0–36.0)
MCV: 92.4 fL (ref 80.0–100.0)
Monocytes Absolute: 0.8 10*3/uL (ref 0.1–1.0)
Monocytes Relative: 10 %
Neutro Abs: 5.5 10*3/uL (ref 1.7–7.7)
Neutrophils Relative %: 68 %
Platelets: 163 10*3/uL (ref 150–400)
RBC: 4.36 MIL/uL (ref 4.22–5.81)
RDW: 13.5 % (ref 11.5–15.5)
WBC: 8.1 10*3/uL (ref 4.0–10.5)
nRBC: 0 % (ref 0.0–0.2)

## 2021-11-03 LAB — SARS CORONAVIRUS 2 BY RT PCR: SARS Coronavirus 2 by RT PCR: NEGATIVE

## 2021-11-03 LAB — GROUP A STREP BY PCR: Group A Strep by PCR: NOT DETECTED

## 2021-11-03 MED ORDER — ACETAMINOPHEN 325 MG PO TABS
650.0000 mg | ORAL_TABLET | Freq: Once | ORAL | Status: AC
  Administered 2021-11-03: 650 mg via ORAL
  Filled 2021-11-03: qty 2

## 2021-11-03 MED ORDER — AZITHROMYCIN 250 MG PO TABS
500.0000 mg | ORAL_TABLET | Freq: Once | ORAL | Status: AC
Start: 2021-11-03 — End: 2021-11-03
  Administered 2021-11-03: 500 mg via ORAL
  Filled 2021-11-03: qty 2

## 2021-11-03 MED ORDER — SODIUM CHLORIDE 0.9 % IV BOLUS
500.0000 mL | Freq: Once | INTRAVENOUS | Status: AC
  Administered 2021-11-03: 500 mL via INTRAVENOUS

## 2021-11-03 MED ORDER — AZITHROMYCIN 250 MG PO TABS: 250.0000 mg | ORAL_TABLET | Freq: Every day | ORAL | 0 refills | Status: DC

## 2021-11-03 NOTE — Discharge Instructions (Addendum)
You were seen in the emergency department for fever cough sore throat.  You had blood work, chest x-ray urinalysis COVID testing and strep testing that were all unremarkable.  We are putting you on an antibiotic.  Please drink plenty of fluids and rest.  Follow-up with your primary care doctor.  Return to the emergency department if any worsening or concerning symptoms.

## 2021-11-03 NOTE — ED Notes (Signed)
Patient verbalizes understanding of discharge instructions. Opportunity for questioning and answers were provided. Armband removed by staff, pt discharged from ED. Ambulated out to lobby with spouse  

## 2021-11-03 NOTE — ED Provider Notes (Signed)
[facility] EMERGENCY DEPT Provider Note   CSN: [id number] Arrival date & time: 11/03/21  1821     History  Chief Complaint  Patient presents with   Fever    [name] is a 88 y.o. male.  He is here with a complaint of feeling unwell since yesterday evening.  He has had a sore throat, nasal congestion, cough productive of some sputum and general malaise.  Wife thinks he is a little confused.  She says he has a history of getting UTIs and confusion without urinary symptoms.  He did not know he had a fever.  He denies any chest pain abdominal pain nausea vomiting diarrhea.  No urinary symptoms.  No sick contacts or recent travel.  The history is provided by the patient and the spouse.  URI Presenting symptoms: congestion, cough, fatigue, fever, rhinorrhea and sore throat   Cough:    Cough characteristics:  Productive   Severity:  Moderate   Onset quality:  Gradual   Duration:  2 days   Timing:  Intermittent   Progression:  Unchanged   Chronicity:  New Severity:  Mild Onset quality:  Gradual Duration:  3 days Timing:  Intermittent Progression:  Improving Relieved by:  None tried Worsened by:  Nothing Ineffective treatments:  None tried Associated symptoms: no headaches and no myalgias   Risk factors: being elderly and immunosuppression   Risk factors: no recent illness, no recent travel and no sick contacts        Home Medications Prior to Admission medications   Medication Sig Start Date End Date Taking? Authorizing Provider  Adalimumab (HUMIRA PEDIATRIC CROHNS START) 40 MG/0.8ML PSKT Inject into the skin.    [provider]  aspirin 81 MG EC tablet TAKE 1 TABLET BY MOUTH EVERY DAY 10/18/20   Yates Decamp, MD  cholecalciferol (VITAMIN D3) 25 MCG (1000 UT) tablet Take 1,000 Units by mouth daily.    [provider]  Coenzyme Q10 (COQ10) 100 MG CAPS Take 1 capsule by mouth daily.    [provider]  metoprolol succinate  (TOPROL-XL) 25 MG 24 hr tablet TAKE 1/2 TABLET BY MOUTH EVERY DAY 09/28/20   Yates Decamp, MD  Multiple Vitamin (MULTIVITAMIN WITH MINERALS) TABS tablet Take 1 tablet by mouth daily.    [provider]  Omega-3 Fatty Acids (FISH OIL PO) Take 1 capsule by mouth at bedtime.    [provider]  simvastatin (ZOCOR) 80 MG tablet Take 40 mg by mouth every evening.     [provider]  tamsulosin (FLOMAX) 0.4 MG CAPS capsule Take 0.4 mg by mouth.    [provider]      Allergies    Oxycodone    Review of Systems   Review of Systems  Constitutional:  Positive for fatigue and fever.  HENT:  Positive for congestion, rhinorrhea and sore throat.   Respiratory:  Positive for cough. Negative for shortness of breath.   Cardiovascular:  Negative for chest pain.  Gastrointestinal:  Negative for abdominal pain.  Genitourinary:  Negative for dysuria.  Musculoskeletal:  Negative for myalgias.  Skin:  Negative for rash.  Neurological:  Negative for headaches.    Physical Exam Updated Vital Signs BP 116/61   Pulse 82   Temp (!) 101 F (38.3 C) (Oral)   Resp 18   SpO2 96%  Physical Exam Vitals and nursing note reviewed.  Constitutional:      General: He is not in acute distress.  Appearance: Normal appearance. He is well-developed.  HENT:     Head: Normocephalic and atraumatic.  Eyes:     Conjunctiva/sclera: Conjunctivae normal.  Cardiovascular:     Rate and Rhythm: Normal rate and regular rhythm.     Heart sounds: No murmur heard. Pulmonary:     Effort: Pulmonary effort is normal. No respiratory distress.     Breath sounds: Normal breath sounds.  Abdominal:     Palpations: Abdomen is soft.     Tenderness: There is no abdominal tenderness. There is no guarding or rebound.  Musculoskeletal:        General: No swelling.     Cervical back: Neck supple.     Right lower leg: No edema.     Left lower leg: No edema.  Skin:    General: Skin is warm and dry.      Capillary Refill: Capillary refill takes less than 2 seconds.  Neurological:     General: No focal deficit present.     Mental Status: He is alert and oriented to person, place, and time.     Cranial Nerves: No cranial nerve deficit.     Sensory: No sensory deficit.     Motor: No weakness.     Gait: Gait normal.     ED Results / Procedures / Treatments   Labs (all labs ordered are listed, but only abnormal results are displayed) Labs Reviewed  BASIC METABOLIC PANEL - Abnormal; Notable for the following components:      Result Value   CO2 21 (*)    Glucose, Bld 104 (*)    All other components within normal limits  URINALYSIS, ROUTINE W REFLEX MICROSCOPIC - Abnormal; Notable for the following components:   Hgb urine dipstick TRACE (*)    Protein, ur TRACE (*)    Bacteria, UA RARE (*)    All other components within normal limits  SARS CORONAVIRUS 2 BY RT PCR  GROUP A STREP BY PCR  CULTURE, BLOOD (ROUTINE X 2)  CULTURE, BLOOD (ROUTINE X 2)  URINE CULTURE  CBC WITH DIFFERENTIAL/PLATELET  LACTIC ACID, PLASMA    EKG None  Radiology DG Chest Port 1 View  Result Date: 11/03/2021 CLINICAL DATA:  Fever, cough EXAM: PORTABLE CHEST 1 VIEW COMPARISON:  09/23/2013 FINDINGS: Scarring in the lung bases. Heart is normal size. Aortic atherosclerosis. Mediastinal contours are within normal limits. No confluent opacities or effusions. No acute bony abnormality. Degenerative changes in the shoulders. IMPRESSION: Chronic changes.  No active disease. Electronically Signed   By: Charlett NoseKevin  Dover M.D.   On: 11/03/2021 19:23    Procedures Procedures    Medications Ordered in ED Medications  sodium chloride 0.9 % bolus 500 mL (0 mLs Intravenous Stopped 11/03/21 2057)  acetaminophen (TYLENOL) tablet 650 mg (650 mg Oral Given 11/03/21 1951)  azithromycin (ZITHROMAX) tablet 500 mg (500 mg Oral Given 11/03/21 2114)    ED Course/ Medical Decision Making/ A&P Clinical Course as of 11/04/21 [id number]   Sat Nov 03, 2021  1922 Chest x-ray interpreted by me as no clear infiltrates.  Awaiting radiology reading. [MB]  2054 Reviewed work-up with patient and his wife.  No indications for admission at this time.  He is hoping to be able to be discharged.  Will cover with antibiotics for possible bronchitis.  Recommended close follow-up with PCP.  He said he used to see Dr. Selena BattenKim and has not gotten a PCP yet but [facility]  Recommended return if  any worsening or concerning symptoms. [MB]    Clinical Course User Index [MB] Terrilee Files, MD                           Medical Decision Making Amount and/or Complexity of Data Reviewed Labs: ordered. Radiology: ordered.  Risk OTC drugs. Prescription drug management.  MARKALE BIRDSELL was evaluated in Emergency Department on 11/03/2021 for the symptoms described in the history of present illness. He was evaluated in the context of the global COVID-19 pandemic, which necessitated consideration that the patient might be at risk for infection with the SARS-CoV-2 virus that causes COVID-19. Institutional protocols and algorithms that pertain to the evaluation of patients at risk for COVID-19 are in a state of rapid change based on information released by regulatory bodies including the CDC and federal and state organizations. These policies and algorithms were followed during the patient's care in the ED.  This patient complains of fever cough congestion sore throat; this involves an extensive number of treatment Options and is a complaint that carries with it a high risk of complications and morbidity. The differential includes COVID, flu, strep, bronchitis, pneumonia, sepsis   I ordered, reviewed and interpreted labs, which included CBC normal, chemistries fairly normal other than low bicarb, COVID-negative, urinalysis without clear signs of infection, lactate normal, blood culture sent urine culture sent I ordered medication IV fluids  Tylenol for fever, oral antibiotics and reviewed PMP when indicated. I ordered imaging studies which included chest x-ray and I independently    visualized and interpreted imaging which showed no acute infiltrates Additional history obtained from patient's wife Previous records obtained and reviewed in epic no recent admissions or ED visits.  Cardiac monitoring reviewed, normal sinus rhythm Social determinants considered, no significant barriers Critical Interventions: None  After the interventions stated above, I reevaluated the patient and found patient to be hemodynamically stable and fairly asymptomatic. Admission and further testing considered, no indications for admission or further work-up at this time.  Will cover with antibiotics for bronchitis.  Recommended close follow-up with PCP and return instructions discussed.          Final Clinical Impression(s) / ED Diagnoses Final diagnoses:  Acute bronchitis, unspecified organism  Sore throat    Rx / DC Orders ED Discharge Orders          Ordered    azithromycin (ZITHROMAX) 250 MG tablet  Daily        11/03/21 2057              Terrilee Files, MD 11/04/21 [phone]

## 2021-11-03 NOTE — ED Notes (Signed)
He c/o fevers since this morning coupled with a feeling of generally "just not feeling well". He states he has a mild cough. His wife volunteers the information that pt. "Has had uti's before".

## 2021-11-06 LAB — URINE CULTURE

## 2021-11-08 LAB — CULTURE, BLOOD (ROUTINE X 2)
Culture: NO GROWTH
Culture: NO GROWTH
Special Requests: ADEQUATE

## 2022-01-22 ENCOUNTER — Encounter: Payer: Self-pay | Admitting: Cardiology

## 2022-01-22 ENCOUNTER — Ambulatory Visit: Payer: PPO | Admitting: Cardiology

## 2022-01-22 VITALS — BP 109/57 | HR 72 | Temp 97.9°F | Resp 16 | Ht 72.0 in | Wt 248.2 lb

## 2022-01-22 DIAGNOSIS — I2510 Atherosclerotic heart disease of native coronary artery without angina pectoris: Secondary | ICD-10-CM

## 2022-01-22 DIAGNOSIS — R002 Palpitations: Secondary | ICD-10-CM

## 2022-01-22 DIAGNOSIS — I453 Trifascicular block: Secondary | ICD-10-CM

## 2022-01-22 MED ORDER — METOPROLOL SUCCINATE ER 25 MG PO TB24: 12.5000 mg | ORAL_TABLET | Freq: Every day | ORAL | 3 refills | Status: DC

## 2022-01-22 NOTE — Progress Notes (Signed)
 Primary Physician/Referring:  Peterson, Daniel L, MD  Patient ID: [name], male    DOB: 07/20/1927, 88 y.o.   MRN: [id number]  Chief Complaint  Patient presents with   Coronary Artery Disease   Follow-up    2 year   HPI:    [name]  is a 88 y.o. coronary artery disease, coronary angiography in 2012 at revealed proximal LAD 50% stenosis and mild noncritical disease in other vessels with normal LVEF.  However he was evaluated for palpitations in 2016 an echocardiogram revealed mild decrease in EF of 45%, mild RV dilatation with mild RV systolic dysfunction.  Nuclear stress test was nonischemic in December 2016.  His past medical history significant for hyperlipidemia, chronic palpitations, bilateral leg edema.  I seen him last 2 years ago.  He remains asymptomatic.  Past Medical History:  Diagnosis Date   Arthritis    STATES HIS SHOULDERS ARE "FROZEN" - VERY LIMITIED RANGE OF MOTION; OA BOTH KNEES   Atherosclerosis of native coronary artery of native heart without angina pectoris 12/29/2018   Bilateral edema of lower extremity 12/29/2018   Coronary artery disease    "MILD NONCRITICAL CORONARY ARTERY DISEASE " - PER OFFICE NOTE DR.  02/27/12 - PT NO LONGER HAS TO SEE CARDIOLGIST UNLESS HE HAS A NEED.   Hx: UTI (urinary tract infection)    Hyperlipidemia    Palpitations 12/29/2018   Psoriasis    Past Surgical History:  Procedure Laterality Date   CARDIAC CATHETERIZATION  02-05-11   TONSILLECTOMY     AGE 15   TOTAL KNEE ARTHROPLASTY Left 10/04/2013   Procedure: LEFT TOTAL KNEE ARTHROPLASTY;  Surgeon: Frank V Aluisio, MD;  Location: [facility];  Service: Orthopedics;  Laterality: Left;   TOTAL KNEE ARTHROPLASTY Right 10/31/2014   Procedure: RIGHT TOTAL KNEE ARTHROPLASTY;  Surgeon: Frank Aluisio, MD;  Location: [facility];  Service: Orthopedics;  Laterality: Right;   History reviewed. No pertinent family history.  Social History   Tobacco Use   Smoking status: Former     Years: 20.00    Types: Cigarettes, Pipe, Cigars    Quit date: 10/26/1983    Years since quitting: 38.2   Smokeless tobacco: Never  Substance Use Topics   Alcohol use: Yes    Comment:   RARE ALCOHOL   Marital Status: Married  ROS  Review of Systems  Cardiovascular:  Positive for leg swelling (stable). Negative for chest pain and dyspnea on exertion.   Objective      01/22/2022    2:32 PM 11/03/2021    8:45 PM 11/03/2021    8:30 PM  Vitals with BMI  Height 6' 0"    Weight 248 lbs 3 oz    BMI 33.65    Systolic 109 115 118  Diastolic 57 63 66  Pulse 72 68 71   Today's Vitals   01/22/22 1432  BP: (!) 109/57  Pulse: 72  Resp: 16  Temp: 97.9 F (36.6 C)  TempSrc: Temporal  SpO2: 93%  Weight: 248 lb 3.2 oz (112.6 kg)  Height: 6' (1.829 m)   Body mass index is 33.66 kg/m.  Physical Exam Constitutional:      Appearance: He is obese.  Neck:     Vascular: No carotid bruit or JVD.  Cardiovascular:     Rate and Rhythm: Normal rate and regular rhythm.     Pulses: Intact distal pulses.     Heart sounds: Heart sounds are distant. No murmur heard.      No gallop.  Pulmonary:     Effort: Pulmonary effort is normal.     Breath sounds: Normal breath sounds.  Abdominal:     General: Bowel sounds are normal.     Palpations: Abdomen is soft.  Musculoskeletal:     Right lower leg: No edema.     Left lower leg: Edema (1-2+ pitting below knee edema) present.     Medications and allergies   Allergies  Allergen Reactions   Oxycodone Other (See Comments)    Couldn't talk. Thought he'd had a stroke     Medication list after today's encounter   Current Outpatient Medications:    Adalimumab (HUMIRA PEDIATRIC CROHNS START) 40 MG/0.8ML PSKT, Inject into the skin., Disp: , Rfl:    aspirin 81 MG chewable tablet, Chew by mouth daily., Disp: , Rfl:    cholecalciferol (VITAMIN D3) 25 MCG (1000 UT) tablet, Take 1,000 Units by mouth daily., Disp: , Rfl:    Coenzyme Q10 (COQ10) 100 MG  CAPS, Take 1 capsule by mouth daily., Disp: , Rfl:    Multiple Vitamin (MULTIVITAMIN WITH MINERALS) TABS tablet, Take 1 tablet by mouth daily., Disp: , Rfl:    Omega-3 Fatty Acids (FISH OIL PO), Take 1 capsule by mouth at bedtime., Disp: , Rfl:    simvastatin (ZOCOR) 80 MG tablet, Take 40 mg by mouth every evening. , Disp: , Rfl:    tamsulosin (FLOMAX) 0.4 MG CAPS capsule, Take 0.4 mg by mouth., Disp: , Rfl:    metoprolol succinate (TOPROL-XL) 25 MG 24 hr tablet, Take 0.5 tablets (12.5 mg total) by mouth daily., Disp: 45 tablet, Rfl: 3  Laboratory examination:   Lab Results  Component Value Date   NA 137 11/03/2021   K 3.7 11/03/2021   CO2 21 (Kramer) 11/03/2021   GLUCOSE 104 (H) 11/03/2021   BUN 12 11/03/2021   CREATININE 0.89 11/03/2021   CALCIUM 9.2 11/03/2021   GFRNONAA >60 11/03/2021       Latest Ref Rng & Units 11/03/2021    7:30 PM 11/02/2014    4:33 AM 11/01/2014    4:25 AM  BMP  Glucose 70 - 99 mg/dL 104  104  147   BUN 8 - 23 mg/dL _0 Creatinine 0.61 - 1.24 mg/dL 0.89  0.72  0.95   Sodium 135 - 145 mmol/Kramer 137  136  140   Potassium 3.5 - 5.1 mmol/Kramer 3.7  4.0  4.5   Chloride 98 - 111 mmol/Kramer 105  104  104   CO2 22 - 32 mmol/Kramer _1 Calcium 8.9 - 10.3 mg/dL 9.2  8.3  8.9       Latest Ref Rng & Units 10/26/2014   10:25 AM 09/23/2013    2:40 PM  Hepatic Function  Total Protein 6.5 - 8.1 g/dL 7.7  7.5   Albumin 3.5 - 5.0 g/dL 4.2  3.9   AST 15 - 41 U/Kramer 24  26   ALT 17 - 63 U/Kramer 25  28   Alk Phosphatase 38 - 126 U/Kramer 61  69   Total Bilirubin 0.3 - 1.2 mg/dL 0.8  0.4     External labs:   Labs 12/16/2019:  Hb 13.3/HCT 39.7, platelets 156,  BUN 17, creatinine 0.87, EGFR 77 mL, potassium 4.6, LFTs normal.  Total cholesterol 132, triglycerides 63, HDL 51, LDL 68.  Radiology:    Cardiac Studies:   Exercise myoview stress 04/28/2015: 1. The resting  electrocardiogram demonstrated normal sinus rhythm, normal resting conduction and no resting arrhythmias. The  stress electrocardiogram was mildly positive. There was 1 mm ST depression in the inferior leads at peak exercsie which normalized at 2 minutes into recovery. occasional PVC. The patient performed treadmill exercise using a Bruce protocol, completing 3:36 minutes, 116% of the maximum predicted heart rate. Hypertensive BP, peak BP198/100 mmHg. Stress symptoms included dyspnea. Reduced aerobic tolerence. 2. Perfusion imaging study demonstrates mild apical thinning without ischemia or scar. Left ventricle systolic function Calculated by QGS was normal at 52% without regional wall motion abnormality. This represents a low risk study.  Echocardiogram 03/09/2015: Left ventricle cavity is normal in size. Mild decrease in LV systolic function. Normal diastolic filling pattern. EF estimated at 45%. Right ventricle cavity is borderline dilated. Mildly reduced right ventricular function. IVC is dilated with respiratory variation. This suggests elevated right heart pressure.  Event Monitor 30 days 03/08/2015: No symptoms reported, Occasional PVC.  Heart cath 02/05/11 Proximal LAD 20-30% calcific and mid RCA 20% stenosis. Normal LVEF. Non crital CAD. 2007, 30-40% stenosis in the proximal to midsegment of the LAD.  EKG:   EKG 01/22/2022: Sinus rhythm with first-degree AV block at rate of 72 bpm, left atrial enlargement, right axis deviation, left posterior fascicular block.  Right bundle branch block.  Trifascicular block.  Nonspecific T abnormality, cannot exclude inferior ischemia. Frequent PACs. Compared to 01/06/2020, right axis deviation and right bundle branch block is new.   Assessment     ICD-10-CM   1. Atherosclerosis of native coronary artery of native heart without angina pectoris  I25.10 EKG 12-Lead    2. Trifascicular block  I45.3     3. Palpitations  R00.2 metoprolol succinate (TOPROL-XL) 25 MG 24 hr tablet       Orders Placed This Encounter  Procedures   EKG 12-Lead    Meds ordered this  encounter  Medications   metoprolol succinate (TOPROL-XL) 25 MG 24 hr tablet    Sig: Take 0.5 tablets (12.5 mg total) by mouth daily.    Dispense:  45 tablet    Refill:  3    Medications Discontinued During This Encounter  Medication Reason   aspirin 81 MG EC tablet    azithromycin (ZITHROMAX) 250 MG tablet    metoprolol succinate (TOPROL-XL) 25 MG 24 hr tablet Reorder     Recommendations:   [name] is a 88 y.o.  with coronary artery disease, coronary angiography in 2012 at revealed proximal LAD 50% stenosis and mild noncritical disease in other vessels with normal LVEF.  However he was evaluated for palpitations in 2016 an echocardiogram revealed mild decrease in EF of 45%, mild RV dilatation with mild RV systolic dysfunction.  Nuclear stress test was nonischemic in December 2016.  His past medical history significant for hyperlipidemia, chronic palpitations, bilateral leg edema.  His leg edema is very mild and has remained stable, symptoms of palpitations have resolved since being on low-dose of beta-blocker.  He has not had any angina pectoris, no clinical signs heart failure.  Today's EKG reveals new onset of trifascicular block.  In view of his advanced age of 88 years and patient being on low-dose beta-blocker, wanted to discontinue this.  However patient does have chronic palpitations that has remained stable and he is on a very low-dose of metoprolol succinate 12.5 mg daily.  Hence I did not make any changes to his medications.  Instruct seeing back on a annual basis, I will see him back   in 6 months for follow-up.  I discussed with him regarding presyncope, syncope, symptoms of heart block and bradycardia.    Adrian Prows, MD, [facility] 01/22/2022, 3:44 PM Office: [phone]

## 2022-05-07 ENCOUNTER — Ambulatory Visit: Payer: PPO | Admitting: Cardiology

## 2022-05-07 ENCOUNTER — Emergency Department (HOSPITAL_COMMUNITY): Payer: PPO

## 2022-05-07 ENCOUNTER — Encounter: Payer: Self-pay | Admitting: Cardiology

## 2022-05-07 ENCOUNTER — Inpatient Hospital Stay (HOSPITAL_COMMUNITY)
Admission: EM | Admit: 2022-05-07 | Discharge: 2022-05-09 | DRG: 244 | Disposition: A | Discharge status: Home / Self Care | Payer: PPO | Attending: Cardiology | Admitting: Cardiology

## 2022-05-07 ENCOUNTER — Encounter (HOSPITAL_COMMUNITY): Payer: Self-pay | Admitting: Cardiology

## 2022-05-07 VITALS — BP 149/54 | HR 37 | Resp 12 | Ht 72.0 in | Wt 267.0 lb

## 2022-05-07 DIAGNOSIS — R001 Bradycardia, unspecified: Secondary | ICD-10-CM | POA: Diagnosis present

## 2022-05-07 DIAGNOSIS — Z96653 Presence of artificial knee joint, bilateral: Secondary | ICD-10-CM | POA: Diagnosis present

## 2022-05-07 DIAGNOSIS — I453 Trifascicular block: Secondary | ICD-10-CM

## 2022-05-07 DIAGNOSIS — I4510 Unspecified right bundle-branch block: Secondary | ICD-10-CM | POA: Diagnosis present

## 2022-05-07 DIAGNOSIS — Z87891 Personal history of nicotine dependence: Secondary | ICD-10-CM | POA: Diagnosis not present

## 2022-05-07 DIAGNOSIS — Z7982 Long term (current) use of aspirin: Secondary | ICD-10-CM

## 2022-05-07 DIAGNOSIS — I442 Atrioventricular block, complete: Secondary | ICD-10-CM

## 2022-05-07 DIAGNOSIS — E785 Hyperlipidemia, unspecified: Secondary | ICD-10-CM | POA: Diagnosis present

## 2022-05-07 DIAGNOSIS — Z885 Allergy status to narcotic agent status: Secondary | ICD-10-CM | POA: Diagnosis not present

## 2022-05-07 DIAGNOSIS — Z96652 Presence of left artificial knee joint: Secondary | ICD-10-CM | POA: Diagnosis present

## 2022-05-07 DIAGNOSIS — Z79620 Long term (current) use of immunosuppressive biologic: Secondary | ICD-10-CM

## 2022-05-07 DIAGNOSIS — Z79899 Other long term (current) drug therapy: Secondary | ICD-10-CM | POA: Diagnosis not present

## 2022-05-07 DIAGNOSIS — L409 Psoriasis, unspecified: Secondary | ICD-10-CM | POA: Diagnosis present

## 2022-05-07 DIAGNOSIS — M1990 Unspecified osteoarthritis, unspecified site: Secondary | ICD-10-CM | POA: Diagnosis present

## 2022-05-07 DIAGNOSIS — R55 Syncope and collapse: Secondary | ICD-10-CM | POA: Diagnosis present

## 2022-05-07 DIAGNOSIS — I2510 Atherosclerotic heart disease of native coronary artery without angina pectoris: Secondary | ICD-10-CM | POA: Diagnosis present

## 2022-05-07 HISTORY — DX: Atrioventricular block, complete: I44.2

## 2022-05-07 LAB — CBC WITH DIFFERENTIAL/PLATELET
Abs Immature Granulocytes: 0.04 10*3/uL (ref 0.00–0.07)
Basophils Absolute: 0.1 10*3/uL (ref 0.0–0.1)
Basophils Relative: 1 %
Eosinophils Absolute: 0.1 10*3/uL (ref 0.0–0.5)
Eosinophils Relative: 1 %
HCT: 38.3 % — ABNORMAL LOW (ref 39.0–52.0)
Hemoglobin: 12.7 g/dL — ABNORMAL LOW (ref 13.0–17.0)
Immature Granulocytes: 0 %
Lymphocytes Relative: 21 %
Lymphs Abs: 2 10*3/uL (ref 0.7–4.0)
MCH: 32.2 pg (ref 26.0–34.0)
MCHC: 33.2 g/dL (ref 30.0–36.0)
MCV: 97 fL (ref 80.0–100.0)
Monocytes Absolute: 1.2 10*3/uL — ABNORMAL HIGH (ref 0.1–1.0)
Monocytes Relative: 12 %
Neutro Abs: 6.2 10*3/uL (ref 1.7–7.7)
Neutrophils Relative %: 65 %
Platelets: 145 10*3/uL — ABNORMAL LOW (ref 150–400)
RBC: 3.95 MIL/uL — ABNORMAL LOW (ref 4.22–5.81)
RDW: 14.3 % (ref 11.5–15.5)
WBC: 9.5 10*3/uL (ref 4.0–10.5)
nRBC: 0 % (ref 0.0–0.2)

## 2022-05-07 LAB — COMPREHENSIVE METABOLIC PANEL
ALT: 14 U/L (ref 0–44)
AST: 30 U/L (ref 15–41)
Albumin: 3.2 g/dL — ABNORMAL LOW (ref 3.5–5.0)
Alkaline Phosphatase: 68 U/L (ref 38–126)
Anion gap: 10 (ref 5–15)
BUN: 30 mg/dL — ABNORMAL HIGH (ref 8–23)
CO2: 20 mmol/L — ABNORMAL LOW (ref 22–32)
Calcium: 8.6 mg/dL — ABNORMAL LOW (ref 8.9–10.3)
Chloride: 108 mmol/L (ref 98–111)
Creatinine, Ser: 1.42 mg/dL — ABNORMAL HIGH (ref 0.61–1.24)
GFR, Estimated: 46 mL/min — ABNORMAL LOW (ref >60)
Glucose, Bld: 107 mg/dL — ABNORMAL HIGH (ref 70–99)
Potassium: 4.8 mmol/L (ref 3.5–5.1)
Sodium: 138 mmol/L (ref 135–145)
Total Bilirubin: 1.5 mg/dL — ABNORMAL HIGH (ref 0.3–1.2)
Total Protein: 6.5 g/dL (ref 6.5–8.1)

## 2022-05-07 LAB — MAGNESIUM: Magnesium: 2.1 mg/dL (ref 1.7–2.4)

## 2022-05-07 LAB — PROTIME-INR
INR: 1.3 — ABNORMAL HIGH (ref 0.8–1.2)
Prothrombin Time: 16.4 seconds — ABNORMAL HIGH (ref 11.4–15.2)

## 2022-05-07 LAB — TROPONIN I (HIGH SENSITIVITY)
Troponin I (High Sensitivity): 19 ng/L — ABNORMAL HIGH (ref <18)
Troponin I (High Sensitivity): 21 ng/L — ABNORMAL HIGH (ref <18)

## 2022-05-07 LAB — TSH: TSH: 2.186 u[IU]/mL (ref 0.350–4.500)

## 2022-05-07 MED ORDER — ATORVASTATIN CALCIUM 40 MG PO TABS
40.0000 mg | ORAL_TABLET | Freq: Every day | ORAL | Status: DC
  Administered 2022-05-09: 40 mg via ORAL
  Filled 2022-05-07: qty 1

## 2022-05-07 MED ORDER — LACTATED RINGERS IV BOLUS
500.0000 mL | Freq: Once | INTRAVENOUS | Status: AC
  Administered 2022-05-07: 500 mL via INTRAVENOUS

## 2022-05-07 MED ORDER — TAMSULOSIN HCL 0.4 MG PO CAPS
0.4000 mg | ORAL_CAPSULE | Freq: Every day | ORAL | Status: DC
  Administered 2022-05-09: 0.4 mg via ORAL
  Filled 2022-05-07: qty 1

## 2022-05-07 NOTE — H&P (Signed)
ELECTROPHYSIOLOGY H&P NOTE    Patient ID: [name] MRN: [id number], DOB/AGE: 28-May-1927 88 y.o.  Admit date: 05/07/2022 Date of Consult: 05/07/2022  Primary Physician: Denton Ar, MD Primary Cardiologist: None  Electrophysiologist: New   Referring Provider: Dr. Jacinto Halim  Patient Profile: [name] is a 88 y.o. male with a history of trifascicular block, CAD, and HLD who is being seen today for the evaluation of CHB at the request of Dr. Jacinto Halim.  HPI:  [name] is a 88 y.o. male with medical history as above.   Seen 01/22/2022 by Dr. Jacinto Halim. Was noted to have new trifascicular block, but decision made to continue low dose BB due to symptomatic palpitations off of it.   Seen in Dr. Verl Dicker office today with reports of syncope, and found to have CHB by EKG. Sent urgently to ED for pacemaker consideration.   Labs pending at time of consultation  Pt wife states pt has seemed more fatigue and SOB with activity over the past 3-4 weeks, since [place] himself. Family later told her that he was also having "syncopal" events down there.   She states pt has had several episodes where he "blacks out" one while walking in the living room, where he was able to sit down slowly as he felt poorly. Another occasion he was showing her something on his tablet and "zoned out and shook" for several seconds.   Denies recent illness otherwise. Wife initially states he "hasn't been taking his medications all the time", but he reports he has been taking his metoprolol regularly.    Past Medical History:  Diagnosis Date   Arthritis    STATES HIS SHOULDERS ARE "FROZEN" - VERY LIMITIED RANGE OF MOTION; OA BOTH KNEES   Atherosclerosis of native coronary artery of native heart without angina pectoris 12/29/2018   Bilateral edema of lower extremity 12/29/2018   Coronary artery disease    "MILD NONCRITICAL CORONARY ARTERY DISEASE " - PER OFFICE  NOTE DR. Jacinto Halim 02/27/12 - PT NO LONGER HAS TO SEE CARDIOLGIST UNLESS HE HAS A NEED.   Hx: UTI (urinary tract infection)    Hyperlipidemia    Palpitations 12/29/2018   Psoriasis      Surgical History:  Past Surgical History:  Procedure Laterality Date   CARDIAC CATHETERIZATION  02-05-11   TONSILLECTOMY     AGE 36   TOTAL KNEE ARTHROPLASTY Left 10/04/2013   Procedure: LEFT TOTAL KNEE ARTHROPLASTY;  Surgeon: Loanne Drilling, MD;  Location: [facility];  Service: Orthopedics;  Laterality: Left;   TOTAL KNEE ARTHROPLASTY Right 10/31/2014   Procedure: RIGHT TOTAL KNEE ARTHROPLASTY;  Surgeon: Ollen Gross, MD;  Location: [facility];  Service: Orthopedics;  Laterality: Right;     (Not in a hospital admission)   Inpatient Medications:   Allergies:  Allergies  Allergen Reactions   Oxycodone Other (See Comments)    Couldn't talk. Thought he'd had a stroke    Social History   Socioeconomic History   Marital status: Married    Spouse name: Not on file   Number of children: 2   Years of education: Not on file   Highest education level: Not on file  Occupational History   Not on file  Tobacco Use   Smoking status: Former    Years: 20.00    Types: Cigarettes, Pipe, Cigars    Quit date: 10/26/1983    Years since quitting: 38.5   Smokeless  tobacco: Never  Vaping Use   Vaping Use: Never used  Substance and Sexual Activity   Alcohol use: Not Currently    Comment:   RARE ALCOHOL   Drug use: No   Sexual activity: Not on file  Other Topics Concern   Not on file  Social History Narrative   Not on file   Social Determinants of Health   Financial Resource Strain: Not on file  Food Insecurity: Not on file  Transportation Needs: Not on file  Physical Activity: Not on file  Stress: Not on file  Social Connections: Not on file  Intimate Partner Violence: Not on file     No family history on file.   Review of Systems: All other systems reviewed and are otherwise negative except as noted  above.  Physical Exam: Vitals:   05/07/22 1524  BP: (!) 130/55  Pulse: (!) 37  Resp: 16  Temp: 98.1 F (36.7 C)  TempSrc: Oral  SpO2: 95%    GEN- The patient is elderly appearing, alert and oriented x 3 today.  VERY hard of hearing HEENT: atraumatic Lungs- Clear to ausculation bilaterally, normal work of breathing.  No wheezes, rales, rhonchi Heart- Slow but regular.  GI- soft, non-tender, non-distended, bowel sounds present Extremities- 1-2+ ankle edema.  MS- no significant deformity or atrophy Skin- warm and dry, no rash or lesion Psych- euthymic mood, full affect Neuro- strength and sensation are intact  Labs:   Lab Results  Component Value Date   WBC 8.1 11/03/2021   HGB 13.5 11/03/2021   HCT 40.3 11/03/2021   MCV 92.4 11/03/2021   PLT 163 11/03/2021   No results for input(s): "NA", "K", "CL", "CO2", "BUN", "CREATININE", "CALCIUM", "PROT", "BILITOT", "ALKPHOS", "ALT", "AST", "GLUCOSE" in the last 168 hours.  Invalid input(s): "LABALBU"    Radiology/Studies: No results found.  EKG: on arrival shows CHB at 35 bpm (personally reviewed)  TELEMETRY: CHB 30s (personally reviewed)  Assessment/Plan: 1.  CHB 2. Stokes Adam Syncope 3. At least bifascicular block (1st degree and RBBB) Unlikely to correct with holding his low dose BB. He also has palpitations while off of this, and h/o CAD.  Hold AV nodal agents for the time being.  Explained risks, benefits, and alternatives to PPM implantation discussed briefly, but pt having trouble hearing me. Will discuss further once he is settled in and not so much activity in the room. Pt and wife verbalized understanding and agree to proceed with pacing at the next available time, likely tomorrow, 05/08/2022 BP stable thus far. As long as remains hemodynamically stable, no need for temp pace.  Keep Pacer Pads on with syncope.   4. CAD Denies s/s ischemia.  Labs pending Update echo.    For questions or updates, please  contact [facility] Please consult [email or web address] for contact info under Cardiology/STEMI.  Dustin Flock, PA-C  05/07/2022 3:32 PM

## 2022-05-07 NOTE — ED Triage Notes (Signed)
Patient arrived to the ED after being sent over by primary care provider. Patient states he went for a normal check up and the doctor sent him over for a slow heart rate. Patient states that he has felt light headed multiple times lately. Patient states he is not in any pain. Patient is A&O x4.

## 2022-05-07 NOTE — ED Provider Notes (Signed)
[name] Provider Note   CSN: [id number] Arrival date & time: 05/07/22  1512     History  Chief Complaint  Patient presents with   Bradycardia    [name] is a 88 y.o. male.  HPI Patient presents for near syncopal symptoms.  This has been ongoing for the past 2 to 3 weeks.  His medical history includes arthritis, CAD, HLD, psoriasis.  He is followed by Dr. Gwynneth Macleod.  He was seen in the office today.  He is prescribed metoprolol but states that he has not been taking his medications.  At cardiology office today, a bradycardia was identified with heart rate in the 30s.  He was normotensive.  He was sent to the ED for admission.  Currently, patient endorses mild lightheadedness but denies any other symptoms.    Home Medications Prior to Admission medications   Medication Sig Start Date End Date Taking? Authorizing Provider  metoprolol succinate (TOPROL-XL) 25 MG 24 hr tablet Take 0.5 tablets (12.5 mg total) by mouth daily. 01/22/22  Yes Yates Decamp, MD  Adalimumab (HUMIRA PEDIATRIC CROHNS START) 40 MG/0.8ML PSKT Inject into the skin. Patient not taking: Reported on 05/07/2022    [provider]  aspirin 81 MG chewable tablet Chew by mouth daily.    [provider]  cholecalciferol (VITAMIN D3) 25 MCG (1000 UT) tablet Take 1,000 Units by mouth daily. Patient not taking: Reported on 05/07/2022    [provider]  Coenzyme Q10 (COQ10) 100 MG CAPS Take 1 capsule by mouth daily. Patient not taking: Reported on 05/07/2022    [provider]  Omega-3 Fatty Acids (FISH OIL PO) Take 1 capsule by mouth at bedtime. Patient not taking: Reported on 05/07/2022    [provider]  simvastatin (ZOCOR) 80 MG tablet Take 40 mg by mouth every evening.  Patient not taking: Reported on 05/07/2022    [provider]  tamsulosin (FLOMAX) 0.4 MG CAPS capsule Take 0.4 mg by mouth.    [provider]       Allergies    Oxycodone    Review of Systems   Review of Systems  Neurological:  Positive for dizziness and light-headedness.  All other systems reviewed and are negative.   Physical Exam Updated Vital Signs BP (!) 114/48 (BP Location: Left Arm)   Pulse (!) 31   Temp 98.1 F (36.7 C) (Oral)   Resp (!) 21   Ht 6' (1.829 m)   Wt 121.1 kg   SpO2 96%   BMI 36.21 kg/m  Physical Exam Vitals and nursing note reviewed.  Constitutional:      General: He is not in acute distress.    Appearance: Normal appearance. He is well-developed. He is not toxic-appearing or diaphoretic.  HENT:     Head: Normocephalic and atraumatic.     Right Ear: External ear normal.     Left Ear: External ear normal.     Nose: Nose normal.     Mouth/Throat:     Mouth: Mucous membranes are moist.  Eyes:     Extraocular Movements: Extraocular movements intact.     Conjunctiva/sclera: Conjunctivae normal.  Cardiovascular:     Rate and Rhythm: Regular rhythm. Bradycardia present.     Heart sounds: No murmur heard. Pulmonary:     Effort: Pulmonary effort is normal. No respiratory distress.     Breath sounds: Normal breath sounds. No wheezing or rales.  Chest:     Chest wall:  No tenderness.  Abdominal:     General: There is no distension.     Palpations: Abdomen is soft.     Tenderness: There is no abdominal tenderness.  Musculoskeletal:        General: No swelling. Normal range of motion.     Cervical back: Normal range of motion and neck supple.     Right lower leg: Edema present.     Left lower leg: Edema present.  Skin:    General: Skin is warm and dry.     Capillary Refill: Capillary refill takes less than 2 seconds.     Coloration: Skin is not jaundiced or pale.  Neurological:     General: No focal deficit present.     Mental Status: He is alert and oriented to person, place, and time.     Cranial Nerves: No cranial nerve deficit.     Sensory: No sensory deficit.     Motor: No weakness.      Coordination: Coordination normal.  Psychiatric:        Mood and Affect: Mood normal.        Behavior: Behavior normal.        Thought Content: Thought content normal.        Judgment: Judgment normal.     ED Results / Procedures / Treatments   Labs (all labs ordered are listed, but only abnormal results are displayed) Labs Reviewed  COMPREHENSIVE METABOLIC PANEL - Abnormal; Notable for the following components:      Result Value   CO2 20 (*)    Glucose, Bld 107 (*)    BUN 30 (*)    Creatinine, Ser 1.42 (*)    Calcium 8.6 (*)    Albumin 3.2 (*)    Total Bilirubin 1.5 (*)    GFR, Estimated 46 (*)    All other components within normal limits  CBC WITH DIFFERENTIAL/PLATELET - Abnormal; Notable for the following components:   RBC 3.95 (*)    Hemoglobin 12.7 (*)    HCT 38.3 (*)    Platelets 145 (*)    Monocytes Absolute 1.2 (*)    All other components within normal limits  PROTIME-INR - Abnormal; Notable for the following components:   Prothrombin Time 16.4 (*)    INR 1.3 (*)    All other components within normal limits  TROPONIN I (HIGH SENSITIVITY) - Abnormal; Notable for the following components:   Troponin I (High Sensitivity) 19 (*)    All other components within normal limits  TSH  MAGNESIUM  TROPONIN I (HIGH SENSITIVITY)    EKG None  Radiology DG Chest Portable 1 View  Result Date: 05/07/2022 CLINICAL DATA:  Bradycardia EXAM: PORTABLE CHEST 1 VIEW COMPARISON:  11/03/2021 FINDINGS: There is pulmonary vascular congestion. Alveolar density right base consistent with developing pneumonia. Right-sided small pleural effusion. Calcified aorta. Enlarged cardiac silhouette. No pneumothorax identified IMPRESSION: Pulmonary vascular congestion. Right basilar consolidation and effusion. Electronically Signed   By: Layla Maw M.D.   On: 05/07/2022 16:08    Procedures Procedures    Medications Ordered in ED Medications  lactated ringers bolus 500 mL (has no  administration in time range)    ED Course/ Medical Decision Making/ A&P                           Medical Decision Making Amount and/or Complexity of Data Reviewed Labs: ordered. Radiology: ordered.  Risk Decision regarding hospitalization.   This  patient presents to the ED for concern of near syncope, this involves an extensive number of treatment options, and is a complaint that carries with it a high risk of complications and morbidity.  The differential diagnosis includes arrhythmia, dehydration, polypharmacy, symptomatic bradycardia   Co morbidities that complicate the patient evaluation  Arthritis, HLD, CAD   Additional history obtained:  Additional history obtained from patient's family External records from outside source obtained and reviewed including EMR   Lab Tests:  I Ordered, and personally interpreted labs.  The pertinent results include: AKI is present.  Electrolytes are normal.  No leukocytosis present.  Initial troponin is very slightly elevated.   Imaging Studies ordered:  I ordered imaging studies including chest x-ray I independently visualized and interpreted imaging which showed pulmonary vascular congestion I agree with the radiologist interpretation   Cardiac Monitoring: / EKG:  The patient was maintained on a cardiac monitor.  I personally viewed and interpreted the cardiac monitored which showed an underlying rhythm of: Complete heart block   Consultations Obtained:  I requested consultation with the cardiology,  and discussed lab and imaging findings as well as pertinent plan - they recommend: Admission to their service   Problem List / ED Course / Critical interventions / Medication management  Patient is a pleasant 88 year old male presenting to the ED from his cardiology office after he is found to have symptomatic bradycardia.  He reports near syncopal symptoms over the past 2 to 3 weeks.  He is prescribed Toprol but states that he  has not been taking it.  He does not check his heart rate or blood pressure at home.  EKG on arrival showed concern of a junctional bradycardia.  He was placed on bedside cardiac monitor.  Pacing pads were placed.  Laboratory workup was initiated.  Fortunately, patient maintains normal blood pressures and is asymptomatic at rest.  He is well-appearing on exam.  Cardiology was consulted.  Repeat EKG confirmed complete heart block.  Patient remained hemodynamically stable.  Cardiology came and evaluated the patient in the ED.  He was admitted to their service. I ordered medication including IV fluids for AKI Reevaluation of the patient after these medicines showed that the patient stayed the same I have reviewed the patients home medicines and have made adjustments as needed   Social Determinants of Health:  Has access to outpatient care  CRITICAL CARE Performed by: Gloris Manchester   Total critical care time: 32 minutes  Critical care time was exclusive of separately billable procedures and treating other patients.  Critical care was necessary to treat or prevent imminent or life-threatening deterioration.  Critical care was time spent personally by me on the following activities: development of treatment plan with patient and/or surrogate as well as nursing, discussions with consultants, evaluation of patient's response to treatment, examination of patient, obtaining history from patient or surrogate, ordering and performing treatments and interventions, ordering and review of laboratory studies, ordering and review of radiographic studies, pulse oximetry and re-evaluation of patient's condition.         Final Clinical Impression(s) / ED Diagnoses Final diagnoses:  Complete heart block (HCC)  Symptomatic bradycardia    Rx / DC Orders ED Discharge Orders     None         Gloris Manchester, MD 05/07/22 1731

## 2022-05-07 NOTE — Progress Notes (Signed)
Here you go

## 2022-05-07 NOTE — Progress Notes (Signed)
Primary Physician/Referring:  Joya Gaskins, MD  Patient ID: FRANCISCA HARBUCK, male    DOB: [birth year], 88 y.o.   MRN: [id number]  Chief Complaint  Patient presents with   Loss of Consciousness        HPI:    SATURNINO LIEW  is a 88 y.o. with coronary artery disease, coronary angiography in 2012 at revealed proximal LAD 50% stenosis and mild noncritical disease in other vessels with normal LVEF.  However he was evaluated for palpitations in 2016 an echocardiogram revealed mild decrease in EF of 45%, mild RV dilatation with mild RV systolic dysfunction.  Nuclear stress test was nonischemic in December 2016. His past medical history significant for hyperlipidemia, chronic palpitations, bilateral leg edema.   This is an urgent visit, over the past 2 weeks, patient has had 3 episodes of syncope, 2 episodes while just sitting suddenly passed out without any premonitory symptoms and the third episode 2 days ago while he was walking at home, suddenly fell to the ground, fortunately did not have any injury.  Presently doing well.  No change in chronic leg edema.  Past Medical History:  Diagnosis Date   Arthritis    STATES HIS SHOULDERS ARE "FROZEN" - VERY Welcome; OA BOTH KNEES   Atherosclerosis of native coronary artery of native heart without angina pectoris 12/29/2018   Bilateral edema of lower extremity 12/29/2018   Coronary artery disease    "MILD NONCRITICAL CORONARY ARTERY DISEASE " - PER OFFICE NOTE DR. Einar Gip 02/27/12 - PT NO LONGER HAS TO SEE CARDIOLGIST UNLESS HE HAS A NEED.   Hx: UTI (urinary tract infection)    Hyperlipidemia    Palpitations 12/29/2018   Psoriasis    Past Surgical History:  Procedure Laterality Date   CARDIAC CATHETERIZATION  02-05-11   TONSILLECTOMY     AGE 88   TOTAL KNEE ARTHROPLASTY Left 10/04/2013   Procedure: LEFT TOTAL KNEE ARTHROPLASTY;  Surgeon: Gearlean Alf, MD;  Location: [facility];  Service: Orthopedics;  Laterality: Left;   TOTAL  KNEE ARTHROPLASTY Right 10/31/2014   Procedure: RIGHT TOTAL KNEE ARTHROPLASTY;  Surgeon: Gaynelle Arabian, MD;  Location: [facility];  Service: Orthopedics;  Laterality: Right;   History reviewed. No pertinent family history.  Social History   Tobacco Use   Smoking status: Former    Years: 20.00    Types: Cigarettes, Pipe, Cigars    Quit date: 10/26/1983    Years since quitting: 38.5   Smokeless tobacco: Never  Substance Use Topics   Alcohol use: Not Currently    Comment:   RARE ALCOHOL   Marital Status: Married  ROS  Review of Systems  Cardiovascular:  Positive for leg swelling (stable) and syncope. Negative for chest pain and dyspnea on exertion.   Objective      05/07/2022    8:15 PM 05/07/2022    7:45 PM 05/07/2022    4:36 PM  Vitals with BMI  Systolic  269 485  Diastolic  60 48  Pulse 28 29    Today's Vitals   05/07/22 1411 05/07/22 1428 05/07/22 1429  BP: (!) 149/54    Pulse: (!) 37    Resp: 12    SpO2: 95% 92% 93%  Weight: 267 lb (121.1 kg)    Height: 6' (1.829 m)     Body mass index is 36.21 kg/m.  Orthostatic VS for the past 72 hrs (Last 3 readings):  Orthostatic BP Patient Position BP Location Cuff Size  Orthostatic Pulse  05/07/22 1429 119/49 Standing Left Arm Large (!) 38  05/07/22 1428 131/48 Sitting Left Arm Large (!) 37  05/07/22 1411 -- Sitting Right Arm Large --     Physical Exam Constitutional:      Appearance: He is obese.  Neck:     Vascular: No carotid bruit or JVD.  Cardiovascular:     Rate and Rhythm: Regular rhythm. Bradycardia present.     Pulses: Intact distal pulses.     Heart sounds: Heart sounds are distant. No murmur heard.    No gallop.  Pulmonary:     Effort: Pulmonary effort is normal.     Breath sounds: Normal breath sounds.  Abdominal:     General: Bowel sounds are normal.     Palpations: Abdomen is soft.  Musculoskeletal:     Right lower leg: No edema.     Left lower leg: Edema (1-2+ pitting below knee edema) present.      Medications and allergies   Allergies  Allergen Reactions   Oxycodone Other (See Comments)    Couldn't talk. Thought he'd had a stroke     Medication list after today's encounter   Current Outpatient Medications:    aspirin 81 MG chewable tablet, Chew by mouth daily. (Patient not taking: Reported on 05/07/2022), Disp: , Rfl:    metoprolol succinate (TOPROL-XL) 25 MG 24 hr tablet, Take 0.5 tablets (12.5 mg total) by mouth daily., Disp: 45 tablet, Rfl: 3   tamsulosin (FLOMAX) 0.4 MG CAPS capsule, Take 0.4 mg by mouth., Disp: , Rfl:    Adalimumab (HUMIRA PEDIATRIC CROHNS START) 40 MG/0.8ML PSKT, Inject into the skin. (Patient not taking: Reported on 05/07/2022), Disp: , Rfl:    cholecalciferol (VITAMIN D3) 25 MCG (1000 UT) tablet, Take 1,000 Units by mouth daily. (Patient not taking: Reported on 05/07/2022), Disp: , Rfl:    Coenzyme Q10 (COQ10) 100 MG CAPS, Take 1 capsule by mouth daily. (Patient not taking: Reported on 05/07/2022), Disp: , Rfl:    Omega-3 Fatty Acids (FISH OIL PO), Take 1 capsule by mouth at bedtime. (Patient not taking: Reported on 05/07/2022), Disp: , Rfl:    simvastatin (ZOCOR) 80 MG tablet, Take 40 mg by mouth every evening.  (Patient not taking: Reported on 05/07/2022), Disp: , Rfl:   Laboratory examination:   Lab Results  Component Value Date   NA 138 05/07/2022   K 4.8 05/07/2022   CO2 20 (L) 05/07/2022   GLUCOSE 107 (H) 05/07/2022   BUN 30 (H) 05/07/2022   CREATININE 1.42 (H) 05/07/2022   CALCIUM 8.6 (L) 05/07/2022   GFRNONAA 46 (L) 05/07/2022       Latest Ref Rng & Units 05/07/2022    3:48 PM 11/03/2021    7:30 PM 11/02/2014    4:33 AM  BMP  Glucose 70 - 99 mg/dL 107  104  104   BUN 8 - 23 mg/dL _0 Creatinine 0.61 - 1.24 mg/dL 1.42  0.89  0.72   Sodium 135 - 145 mmol/L 138  137  136   Potassium 3.5 - 5.1 mmol/L 4.8  3.7  4.0   Chloride 98 - 111 mmol/L 108  105  104   CO2 22 - 32 mmol/L _1 Calcium 8.9 - 10.3 mg/dL 8.6  9.2   8.3       Latest Ref Rng & Units 05/07/2022    3:48 PM 10/26/2014   10:25 AM 09/23/2013  2:40 PM  Hepatic Function  Total Protein 6.5 - 8.1 g/dL 6.5  7.7  7.5   Albumin 3.5 - 5.0 g/dL 3.2  4.2  3.9   AST 15 - 41 U/L _0 ALT 0 - 44 U/L _1 Alk Phosphatase 38 - 126 U/L 68  61  69   Total Bilirubin 0.3 - 1.2 mg/dL 1.5  0.8  0.4     External labs:   Labs 12/16/2019:  Hb 13.3/HCT 39.7, platelets 156,  BUN 17, creatinine 0.87, EGFR 77 mL, potassium 4.6, LFTs normal.  Total cholesterol 132, triglycerides 63, HDL 51, LDL 68.  Radiology:    Cardiac Studies:   Exercise myoview stress 04/28/2015: 1. The resting electrocardiogram demonstrated normal sinus rhythm, normal resting conduction and no resting arrhythmias. The stress electrocardiogram was mildly positive. There was 1 mm ST depression in the inferior leads at peak exercsie which normalized at 2 minutes into recovery. occasional PVC. The patient performed treadmill exercise using a Bruce protocol, completing 3:36 minutes, 116% of the maximum predicted heart rate. Hypertensive BP, peak BP198/100 mmHg. Stress symptoms included dyspnea. Reduced aerobic tolerence. 2. Perfusion imaging study demonstrates mild apical thinning without ischemia or scar. Left ventricle systolic function Calculated by QGS was normal at 52% without regional wall motion abnormality. This represents a low risk study.  Echocardiogram 03/09/2015: Left ventricle cavity is normal in size. Mild decrease in LV systolic function. Normal diastolic filling pattern. EF estimated at 45%. Right ventricle cavity is borderline dilated. Mildly reduced right ventricular function. IVC is dilated with respiratory variation. This suggests elevated right heart pressure.  Event Monitor 30 days 03/08/2015: No symptoms reported, Occasional PVC.  Heart cath 02/05/11 Proximal LAD 20-30% calcific and mid RCA 20% stenosis. Normal LVEF. Non crital CAD. 2007, 30-40% stenosis  in the proximal to midsegment of the LAD.  EKG:   EKG 01/22/2022: Sinus rhythm with first-degree AV block at rate of 72 bpm, left atrial enlargement, right axis deviation, left posterior fascicular block.  Right bundle branch block.  Trifascicular block.  Nonspecific T abnormality, cannot exclude inferior ischemia. Frequent PACs. Compared to 01/06/2020, right axis deviation and right bundle branch block is new.   Assessment     ICD-10-CM   1. Syncope and collapse  R55 EKG 12-Lead    2. Heart block AV complete (HCC)  I44.2     3. 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   Medications Discontinued During This Encounter  Medication Reason   Multiple Vitamin (MULTIVITAMIN WITH MINERALS) TABS tablet      Recommendations:   RILAN EILAND is a 88 y.o.  with coronary artery disease, coronary angiography in 2012 at revealed proximal LAD 50% stenosis and mild noncritical disease in other vessels with normal LVEF.  However he was evaluated for palpitations in 2016 an echocardiogram revealed mild decrease in EF of 45%, mild RV dilatation with mild RV systolic dysfunction.  Nuclear stress test was nonischemic in December 2016. His past medical history significant for hyperlipidemia, chronic palpitations, bilateral leg edema.   1. Syncope and collapse This is an urgent visit, over the past 2 weeks, patient has had 3 episodes of syncope, 2 episodes while just sitting suddenly passed out without any premonitory symptoms and the third episode 2 days  ago while he was walking at home, suddenly fell to the ground, fortunately did not have any injury.  Today physical examination reveals marked bradycardia and complete heart block on EKG and mild orthostatic hypotension.  His syncope is cardiac syncope related to complete heart block.  He is not on any negative inotropic agents.  2. Heart  block AV complete (HCC) EKG confirms presence of complete heart block.  In view of recent recurrent episodes of syncope, underlying trifascicular block at baseline EKG, advanced age, all predisposing for high degree AV node blockade, he has a junctional escape.  He needs to be admitted to the hospital.  I have personally discussed with EP regarding his presentation, reviewed the EKG, and try to make arrangements for him to be directly admitted to the hospital and unfortunately there are no hospital beds available hence had to make arrangements for him to be evaluated in the emergency room and contacted the triage nurse for speeding up the process.  Patient and his family, daughter and wife are aware.  3. Trifascicular block As dictated above.  Electrophysiology consulted, they plan to admit him directly and plan on pacemaker implantation tomorrow.  He will need echocardiogram as he has not had an echocardiogram in many years.  This will be ordered.  Otherwise although 88 years of age, is very active and in fact just made a trip to [place] and drove himself.  I will continue to follow him peripherally.  I set up an office visit tentatively in 2 months to follow-up.  This was a >40-minute office visit encounter.    Adrian Prows, MD, [facility] 05/07/2022, 9:10 PM Office: [phone]

## 2022-05-07 NOTE — ED Notes (Signed)
Patient denies pain and is resting comfortably.  

## 2022-05-08 ENCOUNTER — Inpatient Hospital Stay (HOSPITAL_COMMUNITY): Payer: PPO

## 2022-05-08 ENCOUNTER — Encounter (HOSPITAL_COMMUNITY)
Admission: EM | Disposition: A | Discharge status: Home / Self Care | Payer: Self-pay | Source: Home / Self Care | Attending: Cardiology

## 2022-05-08 DIAGNOSIS — I442 Atrioventricular block, complete: Secondary | ICD-10-CM

## 2022-05-08 HISTORY — PX: PACEMAKER IMPLANT: EP1218

## 2022-05-08 LAB — GLUCOSE, CAPILLARY: Glucose-Capillary: 98 mg/dL (ref 70–99)

## 2022-05-08 LAB — ECHOCARDIOGRAM COMPLETE
Area-P 1/2: 1.5 cm2
Calc EF: 51.9 %
Height: 72 in
MV M vel: 2.07 m/s
MV Peak grad: 17.1 mmHg
Single Plane A2C EF: 45.7 %
Single Plane A4C EF: 56.4 %
Weight: 4271.63 oz

## 2022-05-08 SURGERY — PACEMAKER IMPLANT

## 2022-05-08 MED ORDER — SODIUM CHLORIDE 0.9 % IV SOLN: INTRAVENOUS | Status: DC

## 2022-05-08 MED ORDER — CEFAZOLIN IN SODIUM CHLORIDE 3-0.9 GM/100ML-% IV SOLN
3.0000 g | INTRAVENOUS | Status: AC
  Administered 2022-05-08: 3 g via INTRAVENOUS
  Filled 2022-05-08: qty 100

## 2022-05-08 MED ORDER — SODIUM CHLORIDE 0.9 % IV SOLN
INTRAVENOUS | Status: AC
  Filled 2022-05-08: qty 2

## 2022-05-08 MED ORDER — LIDOCAINE HCL (PF) 1 % IJ SOLN
INTRAMUSCULAR | Status: DC | PRN
  Administered 2022-05-08: 60 mL

## 2022-05-08 MED ORDER — CEFAZOLIN SODIUM-DEXTROSE 1-4 GM/50ML-% IV SOLN
1.0000 g | Freq: Four times a day (QID) | INTRAVENOUS | Status: AC
  Administered 2022-05-08 – 2022-05-09 (×3): 1 g via INTRAVENOUS
  Filled 2022-05-08 (×4): qty 50

## 2022-05-08 MED ORDER — CHLORHEXIDINE GLUCONATE 4 % EX LIQD: 60.0000 mL | Freq: Once | CUTANEOUS | Status: DC

## 2022-05-08 MED ORDER — SODIUM CHLORIDE 0.9 % IV SOLN
80.0000 mg | INTRAVENOUS | Status: AC
  Administered 2022-05-08: 80 mg
  Filled 2022-05-08: qty 2

## 2022-05-08 MED ORDER — HEPARIN (PORCINE) IN NACL 1000-0.9 UT/500ML-% IV SOLN
INTRAVENOUS | Status: DC | PRN
  Administered 2022-05-08: 500 mL

## 2022-05-08 MED ORDER — ACETAMINOPHEN 325 MG PO TABS: 325.0000 mg | ORAL_TABLET | ORAL | Status: DC | PRN

## 2022-05-08 MED ORDER — ONDANSETRON HCL 4 MG/2ML IJ SOLN: 4.0000 mg | Freq: Four times a day (QID) | INTRAMUSCULAR | Status: DC | PRN

## 2022-05-08 SURGICAL SUPPLY — 13 items
CABLE SURGICAL S-101-97-12 (CABLE) ×1 IMPLANT
CATH CPS LOCATOR 3D LRG XLNG (CATHETERS) IMPLANT
CPS IMPLANT KIT 410190 (MISCELLANEOUS) IMPLANT
HELIX LOCKING TOOL (MISCELLANEOUS) ×1
LEAD ULTIPACE 52 LPA1231/52 (Lead) IMPLANT
LEAD ULTIPACE 65 LPA1231/65 (Lead) IMPLANT
PACEMAKER ASSURITY DR-RF (Pacemaker) IMPLANT
PAD DEFIB RADIO PHYSIO CONN (PAD) ×1 IMPLANT
SHEATH 7FR PRELUDE SNAP 13 (SHEATH) IMPLANT
SHEATH 9FR PRELUDE SNAP 13 (SHEATH) IMPLANT
SLITTER AGILIS HISPRO (INSTRUMENTS) IMPLANT
TOOL HELIX LOCKING (MISCELLANEOUS) IMPLANT
TRAY PACEMAKER INSERTION (PACKS) ×1 IMPLANT

## 2022-05-08 NOTE — Progress Notes (Addendum)
Electrophysiology Rounding Note  Patient Name: [name] Date of Encounter: 05/08/2022  Primary Cardiologist: None Electrophysiologist: New   Subjective   No events overnight. Pt remains in CHB. Asymptomatic at rest.   Inpatient Medications    Scheduled Meds:  atorvastatin  40 mg Oral Daily   chlorhexidine  60 mL Topical Once   chlorhexidine  60 mL Topical Once   gentamicin (GARAMYCIN) 80 mg in sodium chloride 0.9 % 500 mL irrigation  80 mg Irrigation On Call   tamsulosin  0.4 mg Oral Daily   Continuous Infusions:  sodium chloride     sodium chloride      ceFAZolin (ANCEF) IV     PRN Meds:    Vital Signs    Vitals:   05/08/22 0530 05/08/22 0600 05/08/22 0630 05/08/22 0730  BP: (!) 141/51 (!) 134/54 134/66 (!) 137/53  Pulse: (!) 30 (!) 35 (!) 31 (!) 30  Resp: (!) 24 16 17  (!) 21  Temp:  98 F (36.7 C)    TempSrc:  Oral    SpO2: 98% 98% 97% 99%  Weight:      Height:       No intake or output data in the 24 hours ending 05/08/22 0947 Filed Weights   05/07/22 1548  Weight: 121.1 kg    Physical Exam    GEN- The patient is elderly appearing, alert and oriented x 3 today.  VERY hard of hearing HEENT- No gross abnormality.  Lungs- Clear to ausculation bilaterally, normal work of breathing Heart-  Slow and slightly irregular  rate and rhythm, no murmurs, rubs or gallops GI- soft, NT, ND, + BS Extremities- no clubbing or cyanosis. No edema Neuro- No obvious focal abnormality.   Labs    CBC Recent Labs    05/07/22 1548  WBC 9.5  NEUTROABS 6.2  HGB 12.7*  HCT 38.3*  MCV 97.0  PLT 145*   Basic Metabolic Panel Recent Labs    14/12/23 1548  NA 138  K 4.8  CL 108  CO2 20*  GLUCOSE 107*  BUN 30*  CREATININE 1.42*  CALCIUM 8.6*  MG 2.1   Liver Function Tests Recent Labs    05/07/22 1548  AST 30  ALT 14  ALKPHOS 68  BILITOT 1.5*  PROT 6.5  ALBUMIN 3.2*   No results for input(s): "LIPASE", "AMYLASE" in the last 72  hours. Cardiac Enzymes No results for input(s): "CKTOTAL", "CKMB", "CKMBINDEX", "TROPONINI" in the last 72 hours.   Telemetry    CHB 30s, rarely dips into upper 20s (personally reviewed)  Radiology    DG Chest Portable 1 View  Result Date: 05/07/2022 CLINICAL DATA:  Bradycardia EXAM: PORTABLE CHEST 1 VIEW COMPARISON:  11/03/2021 FINDINGS: There is pulmonary vascular congestion. Alveolar density right base consistent with developing pneumonia. Right-sided small pleural effusion. Calcified aorta. Enlarged cardiac silhouette. No pneumothorax identified IMPRESSION: Pulmonary vascular congestion. Right basilar consolidation and effusion. Electronically Signed   By: 01/03/2022 M.D.   On: 05/07/2022 16:08    Patient Profile     [name] is a pleasant 88yo man who I am seeing today for an evaluation of CHB at the request of Dr Hollice Espy. He has a history of trifascicular block, CAD, HLD. He has been on a low dose beta blocker. No recent illnesses or fevers. No recent changes in medications. He reports increasingly more severe fatigue and dyspnea with exertion. He reports intermittent syncopal episodes over the past few weeks--mostly while exerting himself.  Assessment & Plan    1.  CHB 2. Stokes Adam Syncope 3. At least bifascicular block (1st degree and RBBB) Unlikely to correct with holding his low dose BB. He also has palpitations while off of this, and h/o CAD.  Explained risks, benefits, and alternatives to PPM implantation discussed briefly, but pt having trouble hearing me. Will discuss further once he is settled in and not so much activity in the room. Pt and wife verbalized understanding yesterday, and pt agrees to proceed again today.  BP stable thus far. As long as remains hemodynamically stable, no need for temp pace.  Keep Pacer Pads on with syncope, and HRs somewhat slower this am.    4. CAD Denies s/s ischemia.  HS trop flat without trend.  Update echo, pending this  am.   For questions or updates, please contact [place] Please consult [email or web address] for contact info under Cardiology/STEMI.  Signed, Shirley Friar, PA-C  05/08/2022, 9:47 AM   EP Attending  Patient seen and examined. Agree with above. His heart block persists. I have discussed the treatment options with the patient and recommend proceeding with DDD PM insertion. We can restart his beta blocker after his PM is placed.   Carleene Overlie Andra Heslin,MD

## 2022-05-08 NOTE — Interval H&P Note (Signed)
History and Physical Interval Note:  05/08/2022 10:40 AM  [name]  has presented today for surgery, with the diagnosis of complete heart block.  The various methods of treatment have been discussed with the patient and family. After consideration of risks, benefits and other options for treatment, the patient has consented to  Procedure(s): PACEMAKER IMPLANT (N/A) as a surgical intervention.  The patient's history has been reviewed, patient examined, no change in status, stable for surgery.  I have reviewed the patient's chart and labs.  Questions were answered to the patient's satisfaction.     Lewayne Bunting

## 2022-05-08 NOTE — Discharge Instructions (Addendum)
After Your Pacemaker   You have a Abbott Pacemaker  ACTIVITY Do not lift your arm above shoulder height for 1 week after your procedure. After 7 days, you may progress as below.  You should remove your sling 24 hours after your procedure, unless otherwise instructed by your provider.     Wednesday May 15, 2022  Thursday May 16, 2022 Friday May 17, 2022 Saturday May 18, 2022   Do not lift, push, pull, or carry anything over 10 pounds with the affected arm until 6 weeks (Wednesday June 19, 2022 ) after your procedure.   You may drive AFTER your wound check, unless you have been told otherwise by your provider.   Ask your healthcare provider when you can go back to work   INCISION/Dressing If you are on a blood thinner such as Coumadin, Xarelto, Eliquis, Plavix, or Pradaxa please confirm with your provider when this should be resumed.   If large square, outer bandage is left in place, this can be removed after 24 hours from your procedure. Do not remove steri-strips or glue as below.   Monitor your Pacemaker site for redness, swelling, and drainage. Call the device clinic at [phone] if you experience these symptoms or fever/chills.  If your incision is sealed with Steri-strips or staples, you may shower 7 days after your procedure or when told by your provider. Do not remove the steri-strips or let the shower hit directly on your site. You may wash around your site with soap and water.    If you were discharged in a sling, please do not wear this during the day more than 48 hours after your surgery unless otherwise instructed. This may increase the risk of stiffness and soreness in your shoulder.   Avoid lotions, ointments, or perfumes over your incision until it is well-healed.  You may use a hot tub or a pool AFTER your wound check appointment if the incision is completely closed.  Pacemaker Alerts:  Some alerts are vibratory and others beep. These are NOT  emergencies. Please call our office to let us know. If this occurs at night or on weekends, it can wait until the next business day. Send a remote transmission.  If your device is capable of reading fluid status (for heart failure), you will be offered monthly monitoring to review this with you.   DEVICE MANAGEMENT Remote monitoring is used to monitor your pacemaker from home. This monitoring is scheduled every 91 days by our office. It allows [place] to keep an eye on the functioning of your device to ensure it is working properly. You will routinely see your Electrophysiologist annually (more often if necessary).   You should receive your ID card for your new device in 4-8 weeks. Keep this card with you at all times once received. Consider wearing a medical alert bracelet or necklace.  Your Pacemaker may be MRI compatible. This will be discussed at your next office visit/wound check.  You should avoid contact with strong electric or magnetic fields.   Do not use amateur (ham) radio equipment or electric (arc) welding torches. MP3 player headphones with magnets should not be used. Some devices are safe to use if held at least 12 inches (30 cm) from your Pacemaker. These include power tools, lawn mowers, and speakers. If you are unsure if something is safe to use, ask your health care provider.  When using your cell phone, hold it to the ear that is on the opposite side from the  Pacemaker. Do not leave your cell phone in a pocket over the Pacemaker.  You may safely use electric blankets, heating pads, computers, and microwave ovens.  Call the office right away if: You have chest pain. You feel more short of breath than you have felt before. You feel more light-headed than you have felt before. Your incision starts to open up.  This information is not intended to replace advice given to you by your health care provider. Make sure you discuss any questions you have with your health care provider.

## 2022-05-08 NOTE — H&P (View-Only) (Signed)
Electrophysiology Rounding Note  Patient Name: [name] Date of Encounter: 05/08/2022  Primary Cardiologist: None Electrophysiologist: New   Subjective   No events overnight. Pt remains in CHB. Asymptomatic at rest.   Inpatient Medications    Scheduled Meds:  atorvastatin  40 mg Oral Daily   chlorhexidine  60 mL Topical Once   chlorhexidine  60 mL Topical Once   gentamicin (GARAMYCIN) 80 mg in sodium chloride 0.9 % 500 mL irrigation  80 mg Irrigation On Call   tamsulosin  0.4 mg Oral Daily   Continuous Infusions:  sodium chloride     sodium chloride      ceFAZolin (ANCEF) IV     PRN Meds:    Vital Signs    Vitals:   05/08/22 0530 05/08/22 0600 05/08/22 0630 05/08/22 0730  BP: (!) 141/51 (!) 134/54 134/66 (!) 137/53  Pulse: (!) 30 (!) 35 (!) 31 (!) 30  Resp: (!) 24 16 17  (!) 21  Temp:  98 F (36.7 C)    TempSrc:  Oral    SpO2: 98% 98% 97% 99%  Weight:      Height:       No intake or output data in the 24 hours ending 05/08/22 0947 Filed Weights   05/07/22 1548  Weight: 121.1 kg    Physical Exam    GEN- The patient is elderly appearing, alert and oriented x 3 today.  VERY hard of hearing HEENT- No gross abnormality.  Lungs- Clear to ausculation bilaterally, normal work of breathing Heart-  Slow and slightly irregular  rate and rhythm, no murmurs, rubs or gallops GI- soft, NT, ND, + BS Extremities- no clubbing or cyanosis. No edema Neuro- No obvious focal abnormality.   Labs    CBC Recent Labs    05/07/22 1548  WBC 9.5  NEUTROABS 6.2  HGB 12.7*  HCT 38.3*  MCV 97.0  PLT 145*   Basic Metabolic Panel Recent Labs    14/12/23 1548  NA 138  K 4.8  CL 108  CO2 20*  GLUCOSE 107*  BUN 30*  CREATININE 1.42*  CALCIUM 8.6*  MG 2.1   Liver Function Tests Recent Labs    05/07/22 1548  AST 30  ALT 14  ALKPHOS 68  BILITOT 1.5*  PROT 6.5  ALBUMIN 3.2*   No results for input(s): "LIPASE", "AMYLASE" in the last 72  hours. Cardiac Enzymes No results for input(s): "CKTOTAL", "CKMB", "CKMBINDEX", "TROPONINI" in the last 72 hours.   Telemetry    CHB 30s, rarely dips into upper 20s (personally reviewed)  Radiology    DG Chest Portable 1 View  Result Date: 05/07/2022 CLINICAL DATA:  Bradycardia EXAM: PORTABLE CHEST 1 VIEW COMPARISON:  11/03/2021 FINDINGS: There is pulmonary vascular congestion. Alveolar density right base consistent with developing pneumonia. Right-sided small pleural effusion. Calcified aorta. Enlarged cardiac silhouette. No pneumothorax identified IMPRESSION: Pulmonary vascular congestion. Right basilar consolidation and effusion. Electronically Signed   By: 01/03/2022 M.D.   On: 05/07/2022 16:08    Patient Profile     [name] is a pleasant 88yo man who I am seeing today for an evaluation of CHB at the request of Dr Hollice Espy. He has a history of trifascicular block, CAD, HLD. He has been on a low dose beta blocker. No recent illnesses or fevers. No recent changes in medications. He reports increasingly more severe fatigue and dyspnea with exertion. He reports intermittent syncopal episodes over the past few weeks--mostly while exerting himself.  Assessment & Plan    1.  CHB 2. Stokes Adam Syncope 3. At least bifascicular block (1st degree and RBBB) Unlikely to correct with holding his low dose BB. He also has palpitations while off of this, and h/o CAD.  Explained risks, benefits, and alternatives to PPM implantation discussed briefly, but pt having trouble hearing me. Will discuss further once he is settled in and not so much activity in the room. Pt and wife verbalized understanding yesterday, and pt agrees to proceed again today.  BP stable thus far. As long as remains hemodynamically stable, no need for temp pace.  Keep Pacer Pads on with syncope, and HRs somewhat slower this am.    4. CAD Denies s/s ischemia.  HS trop flat without trend.  Update echo, pending this  am.   For questions or updates, please contact [place] Please consult [email or web address] for contact info under Cardiology/STEMI.  Signed, Victor Friar, PA-C  05/08/2022, 9:47 AM   EP Attending  Patient seen and examined. Agree with above. His heart block persists. I have discussed the treatment options with the patient and recommend proceeding with DDD PM insertion. We can restart his beta blocker after his PM is placed.   Victor Overlie Briseyda Fehr,MD

## 2022-05-08 NOTE — Discharge Summary (Incomplete)
ELECTROPHYSIOLOGY PROCEDURE DISCHARGE SUMMARY    Patient ID: [name],  MRN: [id number], DOB/AGE: 86/08/1927 86 y.o.  Admit date: 05/07/2022 Discharge date: 05/09/2022  Primary Care Physician: Denton Ar, MD  Primary Cardiologist: None  Electrophysiologist: New to Dr. Ladona Ridgel   Primary Discharge Diagnosis:  Complete Heart Block Stokes Adam Syncope  Secondary Discharge Diagnosis:  CAD  Allergies  Allergen Reactions   Oxycodone Other (See Comments)    Couldn't talk. Thought he'd had a stroke     Procedures This Admission:  1.  Implantation of a Abbott Dual Chamber PPM on 05/08/2022 by Dr. Ladona Ridgel. The patient received a Abbott Assurity [id number] with a Abbott Ultipace 1231-52 right atrial lead and a Abbott Ultipace 1231-65 right ventricular lead.  There were no immediate post procedure complications.   2.  CXR on 05/08/2022 demonstrated no pneumothorax status post device implantation.       Brief HPI: [name] is a 87 y.o. male was admitted for syncope in the setting of complete heart block and electrophysiology team asked to see for consideration of PPM implantation.  Past medical history includes above.  The patient has had AV block without reversible causes identified.  Risks, benefits, and alternatives to PPM implantation were reviewed with the patient who wished to proceed.   Hospital Course:  The patient was admitted and underwent implantation of a St. Jude dual chamber PPM with details as outlined above.  He was monitored on telemetry overnight which demonstrated appropriate pacing.  Left chest was without hematoma or ecchymosis.  The device was interrogated and found to be functioning normally.  CXR was obtained and demonstrated no pneumothorax status post device implantation.  Wound care, arm mobility, and restrictions were reviewed with the patient.  The patient was examined and considered stable for discharge to home.    Anticoagulation  resumption This patient is not on anticoagulation     Physical Exam: Vitals:   05/08/22 2048 05/08/22 2342 05/09/22 0516 05/09/22 0619  BP: (!) 130/47 (!) 145/54 (!) 135/55   Pulse: 70 80 74   Resp: 20 20 (!) 21   Temp: 98.6 F (37 C) 98.2 F (36.8 C) 99.3 F (37.4 C)   TempSrc: Oral Oral Oral   SpO2: 93% 94% 93%   Weight:    121.2 kg  Height:        GEN- The patient is well appearing, alert and oriented x 3 today.   HEENT: normocephalic, atraumatic; sclera clear, conjunctiva pink; hearing intact; oropharynx clear; neck supple, no JVP Lymph- no cervical lymphadenopathy Lungs- Clear to ausculation bilaterally, normal work of breathing.  No wheezes, rales, rhonchi Heart- Regular rate and rhythm, no murmurs, rubs or gallops, PMI not laterally displaced GI- soft, non-tender, non-distended, bowel sounds present, no hepatosplenomegaly Extremities- no clubbing, cyanosis, or edema; DP/PT/radial pulses 2+ bilaterally MS- no significant deformity or atrophy Skin- warm and dry, no rash or lesion, left chest without hematoma/ecchymosis Psych- euthymic mood, full affect Neuro- strength and sensation are intact   Labs:   Lab Results  Component Value Date   WBC 9.5 05/07/2022   HGB 12.7 (L) 05/07/2022   HCT 38.3 (L) 05/07/2022   MCV 97.0 05/07/2022   PLT 145 (L) 05/07/2022    Recent Labs  Lab 05/07/22 1548  NA 138  K 4.8  CL 108  CO2 20*  BUN 30*  CREATININE 1.42*  CALCIUM 8.6*  PROT 6.5  BILITOT 1.5*  ALKPHOS 68  ALT 14  AST 30  GLUCOSE 107*    Discharge Medications:  Allergies as of 05/09/2022       Reactions   Oxycodone Other (See Comments)   Couldn't talk. Thought he'd had a stroke        Medication List     TAKE these medications    acetaminophen 325 MG tablet Commonly known as: TYLENOL Take 1-2 tablets (325-650 mg total) by mouth every 4 (four) hours as needed for mild pain.   aspirin 81 MG chewable tablet Chew by mouth daily.    cholecalciferol 25 MCG (1000 UNIT) tablet Commonly known as: VITAMIN D3 Take 1,000 Units by mouth daily.   CoQ10 100 MG Caps Take 1 capsule by mouth daily.   FISH OIL PO Take 1 capsule by mouth at bedtime.   Humira Pediatric Crohns Start 40 MG/0.8ML Pskt Generic drug: Adalimumab Inject into the skin.   metoprolol succinate 25 MG 24 hr tablet Commonly known as: TOPROL-XL Take 0.5 tablets (12.5 mg total) by mouth daily.   simvastatin 80 MG tablet Commonly known as: ZOCOR Take 40 mg by mouth every evening.   tamsulosin 0.4 MG Caps capsule Commonly known as: FLOMAX Take 0.4 mg by mouth.        Disposition:    Follow-up Information     [facility] Follow up.   Why: on 12/29 at 4 pm for post pacemaker follow up Contact information: [address] [place] [address] [phone]                Duration of Discharge Encounter: Greater than 30 minutes including physician time.  Dustin Flock, PA-C  05/09/2022 8:45 AM  EP Attending  Patient seen and examined. Agree with above. The patient is doing well after undergoin PPM insertion yesterday. He feel much better. His incision is without hematoma. His CXR demonstrates stable lead position and no PTX. Interrogation of the DDD PM under my direction demonstrates normal DDD PM function. He can be discharged home with usual followup.   Sharlot Gowda Destinae Neubecker,MD

## 2022-05-08 NOTE — TOC Progression Note (Signed)
Transition of Care [facility]) - Progression Note    Patient Details  Name: [name] MRN: [id number] Date of Birth: [birth year]  [facility] CM/SW Contact  Leone Haven, RN Phone Number: 05/08/2022, 3:54 PM  Clinical Narrative:    From home, presents with CHB, for Pacemaker implant. TOC following.        Expected Discharge Plan and Services                                                 Social Determinants of Health (SDOH) Interventions    Readmission Risk Interventions     No data to display

## 2022-05-08 NOTE — Progress Notes (Incomplete)
  Echocardiogram 2D Echocardiogram has been performed.  Maren Reamer 05/08/2022, 10:50 AM

## 2022-05-09 ENCOUNTER — Inpatient Hospital Stay (HOSPITAL_COMMUNITY): Payer: PPO

## 2022-05-09 ENCOUNTER — Encounter (HOSPITAL_COMMUNITY): Payer: Self-pay | Admitting: Internal Medicine

## 2022-05-09 DIAGNOSIS — I442 Atrioventricular block, complete: Secondary | ICD-10-CM | POA: Diagnosis not present

## 2022-05-09 LAB — GLUCOSE, CAPILLARY: Glucose-Capillary: 95 mg/dL (ref 70–99)

## 2022-05-09 MED ORDER — ACETAMINOPHEN 325 MG PO TABS: 325.0000 mg | ORAL_TABLET | ORAL | Status: AC | PRN

## 2022-05-09 NOTE — TOC Progression Note (Addendum)
Transition of Care [facility]) - Progression Note    Patient Details  Name: [name] MRN: [id number] Date of Birth: [birth year]  Transition of Care [facility]) CM/SW Contact  Leone Haven, RN Phone Number: 05/09/2022, 8:53 AM  Clinical Narrative:    Patient is for dc today, has no needs.         Expected Discharge Plan and Services           Expected Discharge Date: 05/09/22                                     Social Determinants of Health (SDOH) Interventions    Readmission Risk Interventions     No data to display

## 2022-05-09 NOTE — Progress Notes (Signed)
Pt being d/c, VSS, IV removed, Education complete.   Balinda Quails, RN 05/09/2022 11:18 AM

## 2022-05-09 NOTE — Progress Notes (Signed)
   05/09/22 1100  Mobility  Activity Ambulated with assistance in hallway  Level of Assistance Standby assist, set-up cues, supervision of patient - no hands on  Assistive Device  (Handrails)  Distance Ambulated (ft) 90 ft  Activity Response Tolerated well  Mobility Referral Yes  $Mobility charge 1 Mobility   Mobility Specialist Progress Note  Post-Mobility: 92% SpO2  Pt was EOB and agreeable. Had no c/o pain throughout ambulation. Returned to EOB w/ all needs met and call bell in reach.   Lucious Groves Mobility Specialist  Please contact via SecureChat or Rehab office at [phone]

## 2022-05-15 ENCOUNTER — Emergency Department (HOSPITAL_COMMUNITY): Payer: PPO

## 2022-05-15 ENCOUNTER — Emergency Department (HOSPITAL_COMMUNITY)
Admission: EM | Admit: 2022-05-15 | Discharge: 2022-05-15 | Discharge status: Against Medical Advice | Payer: PPO | Attending: Emergency Medicine | Admitting: Emergency Medicine

## 2022-05-15 ENCOUNTER — Telehealth: Payer: Self-pay | Admitting: Internal Medicine

## 2022-05-15 ENCOUNTER — Encounter (HOSPITAL_COMMUNITY): Payer: Self-pay

## 2022-05-15 ENCOUNTER — Other Ambulatory Visit: Payer: Self-pay

## 2022-05-15 DIAGNOSIS — R531 Weakness: Secondary | ICD-10-CM | POA: Diagnosis not present

## 2022-05-15 DIAGNOSIS — R350 Frequency of micturition: Secondary | ICD-10-CM | POA: Diagnosis not present

## 2022-05-15 DIAGNOSIS — Z5321 Procedure and treatment not carried out due to patient leaving prior to being seen by health care provider: Secondary | ICD-10-CM | POA: Insufficient documentation

## 2022-05-15 DIAGNOSIS — J3489 Other specified disorders of nose and nasal sinuses: Secondary | ICD-10-CM | POA: Insufficient documentation

## 2022-05-15 DIAGNOSIS — R791 Abnormal coagulation profile: Secondary | ICD-10-CM | POA: Insufficient documentation

## 2022-05-15 DIAGNOSIS — R0602 Shortness of breath: Secondary | ICD-10-CM | POA: Diagnosis not present

## 2022-05-15 DIAGNOSIS — R059 Cough, unspecified: Secondary | ICD-10-CM | POA: Insufficient documentation

## 2022-05-15 DIAGNOSIS — Z1152 Encounter for screening for COVID-19: Secondary | ICD-10-CM | POA: Insufficient documentation

## 2022-05-15 LAB — COMPREHENSIVE METABOLIC PANEL
ALT: 19 U/L (ref 0–44)
AST: 28 U/L (ref 15–41)
Albumin: 3.2 g/dL — ABNORMAL LOW (ref 3.5–5.0)
Alkaline Phosphatase: 66 U/L (ref 38–126)
Anion gap: 8 (ref 5–15)
BUN: 10 mg/dL (ref 8–23)
CO2: 22 mmol/L (ref 22–32)
Calcium: 8.5 mg/dL — ABNORMAL LOW (ref 8.9–10.3)
Chloride: 106 mmol/L (ref 98–111)
Creatinine, Ser: 0.93 mg/dL (ref 0.61–1.24)
GFR, Estimated: >60 mL/min (ref >60)
Glucose, Bld: 101 mg/dL — ABNORMAL HIGH (ref 70–99)
Potassium: 3.9 mmol/L (ref 3.5–5.1)
Sodium: 136 mmol/L (ref 135–145)
Total Bilirubin: 0.9 mg/dL (ref 0.3–1.2)
Total Protein: 6.7 g/dL (ref 6.5–8.1)

## 2022-05-15 LAB — RESP PANEL BY RT-PCR (RSV, FLU A&B, COVID)  RVPGX2
Influenza A by PCR: NEGATIVE
Influenza B by PCR: NEGATIVE
Resp Syncytial Virus by PCR: NEGATIVE
SARS Coronavirus 2 by RT PCR: NEGATIVE

## 2022-05-15 LAB — CBC WITH DIFFERENTIAL/PLATELET
Abs Immature Granulocytes: 0.07 10*3/uL (ref 0.00–0.07)
Basophils Absolute: 0.1 10*3/uL (ref 0.0–0.1)
Basophils Relative: 1 %
Eosinophils Absolute: 0.2 10*3/uL (ref 0.0–0.5)
Eosinophils Relative: 1 %
HCT: 39.4 % (ref 39.0–52.0)
Hemoglobin: 12.9 g/dL — ABNORMAL LOW (ref 13.0–17.0)
Immature Granulocytes: 1 %
Lymphocytes Relative: 11 %
Lymphs Abs: 1.5 10*3/uL (ref 0.7–4.0)
MCH: 31.4 pg (ref 26.0–34.0)
MCHC: 32.7 g/dL (ref 30.0–36.0)
MCV: 95.9 fL (ref 80.0–100.0)
Monocytes Absolute: 1 10*3/uL (ref 0.1–1.0)
Monocytes Relative: 8 %
Neutro Abs: 10.6 10*3/uL — ABNORMAL HIGH (ref 1.7–7.7)
Neutrophils Relative %: 78 %
Platelets: 173 10*3/uL (ref 150–400)
RBC: 4.11 MIL/uL — ABNORMAL LOW (ref 4.22–5.81)
RDW: 13.4 % (ref 11.5–15.5)
WBC: 13.4 10*3/uL — ABNORMAL HIGH (ref 4.0–10.5)
nRBC: 0 % (ref 0.0–0.2)

## 2022-05-15 LAB — PROTIME-INR
INR: 1.2 (ref 0.8–1.2)
Prothrombin Time: 15.4 seconds — ABNORMAL HIGH (ref 11.4–15.2)

## 2022-05-15 LAB — APTT: aPTT: 30 seconds (ref 24–36)

## 2022-05-15 LAB — LACTIC ACID, PLASMA: Lactic Acid, Venous: 1 mmol/L (ref 0.5–1.9)

## 2022-05-15 NOTE — Telephone Encounter (Signed)
Pt's daughter states that since pt's procedure on 05/08/22 he has been running a temp of 101, very weak, frequent urination and confused. She would like a callback regarding this matter. Please advise

## 2022-05-15 NOTE — ED Triage Notes (Signed)
Pt had a pacemaker placed last week. Pt has been experiencing increased urination and weakness. Pt woke up w temp of 101. Pt was instructed by his cardiologist to come in for possible infection. Incision site looks dry and intact, no signs of infection. Pt started coughing w running nose started 2 days ago. A&o x4.

## 2022-05-15 NOTE — ED Notes (Signed)
Patient called vitals. No response

## 2022-05-15 NOTE — Telephone Encounter (Signed)
Spoke with patient's emergency contact informed her that it sounds like patient is septic and needs to be evaluated in ED and patient needed to get there ASAP. Patient daughter voiced understanding

## 2022-05-15 NOTE — ED Provider Triage Note (Signed)
Emergency Medicine Provider Triage Evaluation Note  [name] , a 88 y.o. male  was evaluated in triage.  Pt complains of fever, weakness increased urination for the past few days.  Patient had pacemaker implant a week ago, no chest pain or shortness of breath.  Family was concerned that patient has been more tired than normal, had a fever of 100 given Tylenol at 11:30 AM.  Called cardiologist show to the ED for concern for possible sepsis..  Review of Systems  Per HPI  Physical Exam  BP 136/63 (BP Location: Right Arm)   Pulse 80   Temp 98.3 F (36.8 C) (Oral)   Resp 18   Ht 6\' 2"  (1.88 m)   Wt 121 kg   SpO2 93%   BMI 34.25 kg/m  Gen:   Awake, no distress   Resp:  Normal effort  MSK:   Moves extremities without difficulty  Other:  Lower extremity edema.  Surgical scar over decubitus area well-healing with slight erythema but no warmth or fluctuance.  Dry.  Medical Decision Making  Medically screening exam initiated at 2:25 PM.  Appropriate orders placed.  LANSON RANDLE was informed that the remainder of the evaluation will be completed by another provider, this initial triage assessment does not replace that evaluation, and the importance of remaining in the ED until their evaluation is complete.     Karmen Bongo, PA-C 05/15/22 1426

## 2022-05-16 ENCOUNTER — Encounter: Payer: Self-pay | Admitting: Cardiology

## 2022-05-16 ENCOUNTER — Encounter: Payer: Self-pay | Admitting: Internal Medicine

## 2022-05-17 ENCOUNTER — Encounter: Payer: Self-pay | Admitting: Cardiology

## 2022-05-17 DIAGNOSIS — Z45018 Encounter for adjustment and management of other part of cardiac pacemaker: Secondary | ICD-10-CM

## 2022-05-17 DIAGNOSIS — Z950 Presence of cardiac pacemaker: Secondary | ICD-10-CM

## 2022-05-17 HISTORY — DX: Presence of cardiac pacemaker: Z95.0

## 2022-05-17 HISTORY — DX: Encounter for adjustment and management of other part of cardiac pacemaker: Z45.018

## 2022-05-17 NOTE — Telephone Encounter (Signed)
Patient needs appointment in couple of weeks per [facility]

## 2022-05-17 NOTE — Telephone Encounter (Signed)
From patient.

## 2022-05-20 LAB — CULTURE, BLOOD (ROUTINE X 2)
Culture: NO GROWTH
Special Requests: ADEQUATE

## 2022-05-24 ENCOUNTER — Ambulatory Visit: Payer: PPO | Attending: Cardiovascular Disease

## 2022-05-24 DIAGNOSIS — I442 Atrioventricular block, complete: Secondary | ICD-10-CM

## 2022-05-24 LAB — CUP PACEART INCLINIC DEVICE CHECK
Brady Statistic RA Percent Paced: 1.8 %
Brady Statistic RV Percent Paced: 99 %
Date Time Interrogation Session: 20231229212822
Implantable Lead Connection Status: 753985
Implantable Lead Connection Status: 753985
Implantable Lead Implant Date: 20231213
Implantable Lead Implant Date: 20231213
Implantable Lead Location: 753859
Implantable Lead Location: 753860
Implantable Pulse Generator Implant Date: 20231213
Lead Channel Impedance Value: 410 Ohm
Lead Channel Impedance Value: 450 Ohm
Lead Channel Pacing Threshold Amplitude: 0.5 V
Lead Channel Pacing Threshold Amplitude: 0.75 V
Lead Channel Pacing Threshold Amplitude: 0.75 V
Lead Channel Pacing Threshold Pulse Width: 0.5 ms
Lead Channel Pacing Threshold Pulse Width: 0.5 ms
Lead Channel Pacing Threshold Pulse Width: 0.5 ms
Lead Channel Sensing Intrinsic Amplitude: 2.1 mV
Pulse Gen Model: 2272
Pulse Gen Serial Number: 8129288

## 2022-05-24 NOTE — Progress Notes (Signed)
Wound check appointment. Steri-strips removed. Wound without redness or edema. 1 stitch noted at proximal end of incision line.  no signs of infection.  stitch removed and steri strips placed over area.  Patient is to wait until Monday 1/1/124 until he showers and leave strips in place unless they fall of on own, and follow up in 1 week for wound check on 05/31/22.  Normal device function. Thresholds, sensing, and impedances consistent with implant measurements. Device programmed at 3.5V/auto capture programmed on for extra safety margin until 3 month visit. Histogram distribution appropriate for patient and level of activity. 2 Mode switches occurred, longest 10 secs/fastest a rate 181, atrial tach with controlled, normal V rates. No high ventricular rates noted. Patient educated about wound care, arm mobility, lifting restrictions. ROV in 3 months with implanting physician.

## 2022-05-24 NOTE — Patient Instructions (Signed)
   After Your Pacemaker   Monitor your pacemaker site for redness, swelling, and drainage. Call the device clinic at [phone] if you experience these symptoms or fever/chills.  Your incision is still healing.  Steri strip still in place.  No showers until Monday 05/27/2022.  Keep area clean and dry no lotions, perfumes, ointments or powders over the area until completely healed.  Return for wound check only next Friday, January 5th at 10:30am.   You may use a hot tub or a pool after your wound check appointment if the incision is completely closed.  Do not lift, push or pull greater than 10 pounds with the affected arm until 6 weeks after your procedure. There are no other restrictions in arm movement after your wound check appointment. Until after JANUARY 24TH.   You may drive, unless driving has been restricted by your healthcare providers.   Remote monitoring is used to monitor your pacemaker from home. This monitoring is scheduled every 91 days by our office. It allows [place] to keep an eye on the functioning of your device to ensure it is working properly. You will routinely see your Electrophysiologist annually (more often if necessary).

## 2022-05-29 ENCOUNTER — Encounter: Payer: Self-pay | Admitting: Cardiology

## 2022-05-31 ENCOUNTER — Ambulatory Visit: Payer: PPO | Attending: Interventional Cardiology

## 2022-05-31 DIAGNOSIS — I442 Atrioventricular block, complete: Secondary | ICD-10-CM

## 2022-05-31 NOTE — Progress Notes (Signed)
Patient came in today for recheck on wound from 12/29 device clinic appt.  Steri strips removed. Wound edges well approximated. No signs of infection, redness or irration. Wound well healed. Reminded patient of continued lift/push/pull restrictions per original instructions.  Also confirmed patient's remote device is transmitting to Dr. Einar Gip. Referred patient and daughter to [facility]'s response on 05/29/22 remote manual transmission (normal).

## 2022-06-03 DIAGNOSIS — H6123 Impacted cerumen, bilateral: Secondary | ICD-10-CM | POA: Diagnosis not present

## 2022-07-24 ENCOUNTER — Ambulatory Visit: Payer: PPO | Admitting: Cardiology

## 2022-08-06 ENCOUNTER — Encounter: Payer: Self-pay | Admitting: Cardiology

## 2022-08-06 ENCOUNTER — Ambulatory Visit: Payer: PPO | Admitting: Cardiology

## 2022-08-06 VITALS — BP 128/61 | HR 90 | Resp 17 | Ht 74.0 in | Wt 239.0 lb

## 2022-08-06 DIAGNOSIS — Z950 Presence of cardiac pacemaker: Secondary | ICD-10-CM

## 2022-08-06 DIAGNOSIS — I442 Atrioventricular block, complete: Secondary | ICD-10-CM

## 2022-08-06 NOTE — Progress Notes (Signed)
Primary Physician/Referring:  Donnajean Lopes, MD  Patient ID: [name], male    DOB: 06/19/1927, 89 y.o.   MRN: [id number]  Chief Complaint  Patient presents with   Coronary Artery Disease   Trifascicular block   Palpitations   Follow-up    6 months   HPI:    [name]  is a 89 y.o.with very mild to moderate coronary artery disease by angiography in 2012 at revealed proximal LAD 50% stenosis and mild noncritical disease in other vessels with normal LVEF, hyperlipidemia, chronic palpitations, bilateral leg edema. He was seen by me on 05/07/2022 with complete heart block and syncope and maintaining meds for hospital admissions and he underwent permanent pacemaker implantation on 05/08/2022 by implantation of Abbott dual-chamber pacemaker.  No recurrent syncope, he is feeling the best he has in quite a while.  Past Medical History:  Diagnosis Date   Arthritis    STATES HIS SHOULDERS ARE "FROZEN" - VERY [place]; OA BOTH KNEES   Atherosclerosis of native coronary artery of native heart without angina pectoris 12/29/2018   Bilateral edema of lower extremity 12/29/2018   Complete heart block ([place]) 05/07/2022   Coronary artery disease    "MILD NONCRITICAL CORONARY ARTERY DISEASE " - PER OFFICE NOTE DR. Einar Gip 02/27/12 - PT NO LONGER HAS TO SEE CARDIOLGIST UNLESS HE HAS A NEED.   Encounter for care of pacemaker 05/17/2022   Hx: UTI (urinary tract infection)    Hyperlipidemia    Pacemaker Dual Chamber Abbott PACEMAKER ASSURITY DR 05/08/2022 05/17/2022   Palpitations 12/29/2018   Psoriasis    Past Surgical History:  Procedure Laterality Date   CARDIAC CATHETERIZATION  02-05-11   PACEMAKER IMPLANT N/A 05/08/2022   Procedure: PACEMAKER IMPLANT;  Surgeon: Evans Lance, MD;  Location: [facility];  Service: Cardiovascular;  Laterality: N/A;   TONSILLECTOMY     AGE 89   TOTAL KNEE ARTHROPLASTY Left 10/04/2013   Procedure: LEFT TOTAL KNEE  ARTHROPLASTY;  Surgeon: Gearlean Alf, MD;  Location: [facility];  Service: Orthopedics;  Laterality: Left;   TOTAL KNEE ARTHROPLASTY Right 10/31/2014   Procedure: RIGHT TOTAL KNEE ARTHROPLASTY;  Surgeon: Gaynelle Arabian, MD;  Location: [facility];  Service: Orthopedics;  Laterality: Right;   History reviewed. No pertinent family history.  Social History   Tobacco Use   Smoking status: Former    Packs/day: 0.50    Years: 20.00    Total pack years: 10.00    Types: Cigarettes, Pipe, Cigars    Quit date: 10/26/1983    Years since quitting: 38.8   Smokeless tobacco: Never  Substance Use Topics   Alcohol use: Not Currently    Comment:   RARE ALCOHOL   Marital Status: Married  ROS  Review of Systems  Cardiovascular:  Positive for leg swelling (stable). Negative for chest pain and dyspnea on exertion.   Objective      08/06/2022    3:08 PM 05/15/2022    4:19 PM 05/15/2022    2:10 PM  Vitals with BMI  Height '6\' 2"'$   '6\' 2"'$   Weight 239 lbs  266 lbs 12 oz  BMI [id number]  [id number]  Systolic 0000000 [id number]  Diastolic 61 67 63  Pulse 90 75 80   Today's Vitals   08/06/22 1508  BP: 128/61  Pulse: 90  Resp: 17  SpO2: 95%  Weight: 239 lb (108.4 kg)  Height: '6\' 2"'$  (1.88 m)   Body mass index  is 30.69 kg/m.  Orthostatic VS for the past 72 hrs (Last 3 readings):  Patient Position BP Location Cuff Size  08/06/22 1508 Sitting Left Arm Large     Physical Exam Constitutional:      Appearance: He is obese.  Neck:     Vascular: No carotid bruit or JVD.  Cardiovascular:     Rate and Rhythm: Regular rhythm.     Pulses: Intact distal pulses.     Heart sounds: Heart sounds are distant. No murmur heard.    No gallop.  Pulmonary:     Effort: Pulmonary effort is normal.     Breath sounds: Normal breath sounds.  Abdominal:     General: Bowel sounds are normal.     Palpations: Abdomen is soft.  Musculoskeletal:     Right lower leg: No edema.     Left lower leg: Edema (1-2+ pitting below knee edema)  present.     Medications and allergies   Allergies  Allergen Reactions   Oxycodone Other (See Comments)    Couldn't talk. Thought he'd had a stroke     Medication list after today's encounter   Current Outpatient Medications:    acetaminophen (TYLENOL) 325 MG tablet, Take 1-2 tablets (325-650 mg total) by mouth every 4 (four) hours as needed for mild pain., Disp: , Rfl:    Adalimumab (HUMIRA PEDIATRIC CROHNS START) 40 MG/0.8ML PSKT, Inject into the skin., Disp: , Rfl:    aspirin 81 MG chewable tablet, Chew by mouth daily., Disp: , Rfl:    cholecalciferol (VITAMIN D3) 25 MCG (1000 UT) tablet, Take 1,000 Units by mouth daily., Disp: , Rfl:    Coenzyme Q10 (COQ10) 100 MG CAPS, Take 1 capsule by mouth daily., Disp: , Rfl:    metoprolol succinate (TOPROL-XL) 25 MG 24 hr tablet, Take 0.5 tablets (12.5 mg total) by mouth daily., Disp: 45 tablet, Rfl: 3   Omega-3 Fatty Acids (FISH OIL PO), Take 1 capsule by mouth at bedtime., Disp: , Rfl:    simvastatin (ZOCOR) 80 MG tablet, Take 40 mg by mouth every evening., Disp: , Rfl:    tamsulosin (FLOMAX) 0.4 MG CAPS capsule, Take 0.4 mg by mouth., Disp: , Rfl:   Laboratory examination:   Lab Results  Component Value Date   NA 136 05/15/2022   K 3.9 05/15/2022   CO2 22 05/15/2022   GLUCOSE 101 (H) 05/15/2022   BUN 10 05/15/2022   CREATININE 0.93 05/15/2022   CALCIUM 8.5 (L) 05/15/2022   GFRNONAA >60 05/15/2022       Latest Ref Rng & Units 05/15/2022    2:50 PM 05/07/2022    3:48 PM 11/03/2021    7:30 PM  BMP  Glucose 70 - 99 mg/dL 101  107  104   BUN 8 - 23 mg/dL '10  30  12   '$ Creatinine 0.61 - 1.24 mg/dL 0.93  1.42  0.89   Sodium 135 - 145 mmol/L 136  138  137   Potassium 3.5 - 5.1 mmol/L 3.9  4.8  3.7   Chloride 98 - 111 mmol/L 106  108  105   CO2 22 - 32 mmol/L '22  20  21   '$ Calcium 8.9 - 10.3 mg/dL 8.5  8.6  9.2       Latest Ref Rng & Units 05/15/2022    2:50 PM 05/07/2022    3:48 PM 10/26/2014   10:25 AM  Hepatic Function   Total Protein 6.5 - 8.1 g/dL 6.7  6.5  7.7   Albumin 3.5 - 5.0 g/dL 3.2  3.2  4.2   AST 15 - 41 U/L '28  30  24   '$ ALT 0 - 44 U/L '19  14  25   '$ Alk Phosphatase 38 - 126 U/L 66  68  61   Total Bilirubin 0.3 - 1.2 mg/dL 0.9  1.5  0.8     External labs:   Labs 12/16/2019:  Hb 13.3/HCT 39.7, platelets 156,  BUN 17, creatinine 0.87, EGFR 77 mL, potassium 4.6, LFTs normal.  Total cholesterol 132, triglycerides 63, HDL 51, LDL 68.  Radiology:    Cardiac Studies:   Exercise myoview stress 04/28/2015: 1. The resting electrocardiogram demonstrated normal sinus rhythm, normal resting conduction and no resting arrhythmias. The stress electrocardiogram was mildly positive. There was 1 mm ST depression in the inferior leads at peak exercsie which normalized at 2 minutes into recovery. occasional PVC. The patient performed treadmill exercise using a Bruce protocol, completing 3:36 minutes, 116% of the maximum predicted heart rate. Hypertensive BP, peak BP198/100 mmHg. Stress symptoms included dyspnea. Reduced aerobic tolerence. 2. Perfusion imaging study demonstrates mild apical thinning without ischemia or scar. Left ventricle systolic function Calculated by QGS was normal at 52% without regional wall motion abnormality. This represents a low risk study.  Echocardiogram 03/09/2015: Left ventricle cavity is normal in size. Mild decrease in LV systolic function. Normal diastolic filling pattern. EF estimated at 45%. Right ventricle cavity is borderline dilated. Mildly reduced right ventricular function. IVC is dilated with respiratory variation. This suggests elevated right heart pressure.   Heart cath 02/05/11 Proximal LAD 20-30% calcific and mid RCA 20% stenosis. Normal LVEF. Non crital CAD. 2007, 30-40% stenosis in the proximal to midsegment of the LAD.   Pacemaker Dual [place] DR 05/08/2022    Remote dual-chamber pacemaker transmission 05/28/2022: Longevity >5.5 years.   AP <1%, VP >99%.  Lead impedance and thresholds are normal.  1 AMS episode, brief 40 seconds AT/AF.  Normal pacemaker function.  EKG:   EKG 08/06/2022: Atrially sensed, ventricularly paced rhythm at the rate of 80 bpm with first-degree AV block.  No further analysis.  Compared to 05/07/2022, complete heart block with junctional escape at the rate of 36 bpm is no longer present.  Assessment     ICD-10-CM   1. Heart block AV complete (HCC)  I44.2 EKG 12-Lead    2. Pacemaker Dual [place] DR 05/08/2022  Z95.0       Orders Placed This Encounter  Procedures   EKG 12-Lead   No orders of the defined types were placed in this encounter.  There are no discontinued medications.    Recommendations:   [name] is a 89 y.o.  with very mild to moderate coronary artery disease by angiography in 2012 at revealed proximal LAD 50% stenosis and mild noncritical disease in other vessels with normal LVEF, hyperlipidemia, chronic palpitations, bilateral leg edema. He was seen by me on 05/07/2022 with complete heart block and syncope and maintaining meds for hospital admissions and he underwent permanent pacemaker implantation on 05/08/2022 by implantation of Abbott dual-chamber pacemaker.  No recurrent syncope, he is feeling the best he has in quite a while.  1. Heart block AV complete ([place]) He is not pacing 100% of the time in the V.  Basically transmission a month ago was completely normal.  I reviewed the stents patient cranial nerves today on the right, no alerts.  2. Pacemaker Dual Chamber  Abbott PACEMAKER ASSURITY DR 05/08/2022 Dual-chamber pacemaker is functioning normally.  He will continue to transmit remotely.  He has a 66-monthappointment with Dr. GCristopher Perusoon for device check.  His pacemaker site has healed well.  I will see him back in 6 months and if he remains stable, on annual basis.  I have encouraged him to increase his physical activity, although 89 y.o. for age, he is very active and has been doing well.   JAdrian Prows MD, F[facility] - [facility]3/04/2023, 6:26 PM Office: [phone]

## 2022-08-08 DIAGNOSIS — Z45018 Encounter for adjustment and management of other part of cardiac pacemaker: Secondary | ICD-10-CM | POA: Diagnosis not present

## 2022-08-08 DIAGNOSIS — I442 Atrioventricular block, complete: Secondary | ICD-10-CM | POA: Diagnosis not present

## 2022-08-20 DIAGNOSIS — Z1339 Encounter for screening examination for other mental health and behavioral disorders: Secondary | ICD-10-CM | POA: Diagnosis not present

## 2022-08-20 DIAGNOSIS — Z950 Presence of cardiac pacemaker: Secondary | ICD-10-CM | POA: Diagnosis not present

## 2022-08-20 DIAGNOSIS — R9389 Abnormal findings on diagnostic imaging of other specified body structures: Secondary | ICD-10-CM | POA: Diagnosis not present

## 2022-08-20 DIAGNOSIS — E7800 Pure hypercholesterolemia, unspecified: Secondary | ICD-10-CM | POA: Diagnosis not present

## 2022-08-20 DIAGNOSIS — I2510 Atherosclerotic heart disease of native coronary artery without angina pectoris: Secondary | ICD-10-CM | POA: Diagnosis not present

## 2022-08-20 DIAGNOSIS — Z1331 Encounter for screening for depression: Secondary | ICD-10-CM | POA: Diagnosis not present

## 2022-08-20 DIAGNOSIS — R2681 Unsteadiness on feet: Secondary | ICD-10-CM | POA: Diagnosis not present

## 2022-08-20 DIAGNOSIS — I10 Essential (primary) hypertension: Secondary | ICD-10-CM | POA: Diagnosis not present

## 2022-08-23 ENCOUNTER — Encounter: Payer: Self-pay | Admitting: Internal Medicine

## 2022-08-23 ENCOUNTER — Ambulatory Visit: Payer: PPO | Attending: Internal Medicine | Admitting: Internal Medicine

## 2022-08-23 VITALS — BP 132/62 | HR 80 | Ht 74.0 in | Wt 236.4 lb

## 2022-08-23 DIAGNOSIS — Z950 Presence of cardiac pacemaker: Secondary | ICD-10-CM | POA: Diagnosis not present

## 2022-08-23 DIAGNOSIS — R002 Palpitations: Secondary | ICD-10-CM

## 2022-08-23 DIAGNOSIS — I442 Atrioventricular block, complete: Secondary | ICD-10-CM | POA: Diagnosis not present

## 2022-08-23 LAB — CUP PACEART INCLINIC DEVICE CHECK
Battery Remaining Longevity: 98 mo
Battery Voltage: 2.98 V
Brady Statistic RA Percent Paced: 17 %
Brady Statistic RV Percent Paced: 99.27 %
Date Time Interrogation Session: 20240329161131
Implantable Lead Connection Status: 753985
Implantable Lead Connection Status: 753985
Implantable Lead Implant Date: 20231213
Implantable Lead Implant Date: 20231213
Implantable Lead Location: 753859
Implantable Lead Location: 753860
Implantable Pulse Generator Implant Date: 20231213
Lead Channel Impedance Value: 400 Ohm
Lead Channel Impedance Value: 450 Ohm
Lead Channel Pacing Threshold Amplitude: 0.75 V
Lead Channel Pacing Threshold Amplitude: 0.75 V
Lead Channel Pacing Threshold Amplitude: 0.75 V
Lead Channel Pacing Threshold Amplitude: 0.75 V
Lead Channel Pacing Threshold Pulse Width: 0.5 ms
Lead Channel Pacing Threshold Pulse Width: 0.5 ms
Lead Channel Pacing Threshold Pulse Width: 0.5 ms
Lead Channel Pacing Threshold Pulse Width: 0.5 ms
Lead Channel Sensing Intrinsic Amplitude: 1.8 mV
Lead Channel Setting Pacing Amplitude: 2 V
Lead Channel Setting Pacing Amplitude: 2.5 V
Lead Channel Setting Pacing Pulse Width: 0.5 ms
Lead Channel Setting Sensing Sensitivity: 5 mV
Pulse Gen Model: 2272
Pulse Gen Serial Number: 8129288

## 2022-08-23 NOTE — Patient Instructions (Signed)

## 2022-08-23 NOTE — Progress Notes (Signed)
HPI [name] returns today for followup. He is a pleasant 89 yo man with a h/o CAD, HTN, dyslipidemia and CHB, s/p PPM insertion. The patient underwent PPM 3 months ago. In the interim, he has done well with no chest pain or sob. He does have some peripheral edema. No syncope since her PPM insertion. Allergies  Allergen Reactions   Oxycodone Other (See Comments)    Couldn't talk. Thought he'd had a stroke     Current Outpatient Medications  Medication Sig Dispense Refill   acetaminophen (TYLENOL) 325 MG tablet Take 1-2 tablets (325-650 mg total) by mouth every 4 (four) hours as needed for mild pain.     Adalimumab (HUMIRA PEDIATRIC CROHNS START) 40 MG/0.8ML PSKT Inject into the skin.     aspirin 81 MG chewable tablet Chew by mouth daily.     cholecalciferol (VITAMIN D3) 25 MCG (1000 UT) tablet Take 1,000 Units by mouth daily.     Coenzyme Q10 (COQ10) 100 MG CAPS Take 1 capsule by mouth daily.     metoprolol succinate (TOPROL-XL) 25 MG 24 hr tablet Take 0.5 tablets (12.5 mg total) by mouth daily. 45 tablet 3   Omega-3 Fatty Acids (FISH OIL PO) Take 1 capsule by mouth at bedtime.     simvastatin (ZOCOR) 80 MG tablet Take 40 mg by mouth every evening.     tamsulosin (FLOMAX) 0.4 MG CAPS capsule Take 0.4 mg by mouth.     No current facility-administered medications for this visit.     Past Medical History:  Diagnosis Date   Arthritis    STATES HIS SHOULDERS ARE "FROZEN" - VERY [place]; OA BOTH KNEES   Atherosclerosis of native coronary artery of native heart without angina pectoris 12/29/2018   Bilateral edema of lower extremity 12/29/2018   Complete heart block ([place]) 05/07/2022   Coronary artery disease    "MILD NONCRITICAL CORONARY ARTERY DISEASE " - PER OFFICE NOTE DR. Einar Gip 02/27/12 - PT NO LONGER HAS TO SEE CARDIOLGIST UNLESS HE HAS A NEED.   Encounter for care of pacemaker 05/17/2022   Hx: UTI (urinary tract infection)    Hyperlipidemia    Pacemaker  Dual Chamber Abbott PACEMAKER ASSURITY DR 05/08/2022 05/17/2022   Palpitations 12/29/2018   Psoriasis     ROS:   All systems reviewed and negative except as noted in the HPI.   Past Surgical History:  Procedure Laterality Date   CARDIAC CATHETERIZATION  02-05-11   PACEMAKER IMPLANT N/A 05/08/2022   Procedure: PACEMAKER IMPLANT;  Surgeon: Evans Lance, MD;  Location: [facility];  Service: Cardiovascular;  Laterality: N/A;   TONSILLECTOMY     AGE 57   TOTAL KNEE ARTHROPLASTY Left 10/04/2013   Procedure: LEFT TOTAL KNEE ARTHROPLASTY;  Surgeon: Gearlean Alf, MD;  Location: [facility];  Service: Orthopedics;  Laterality: Left;   TOTAL KNEE ARTHROPLASTY Right 10/31/2014   Procedure: RIGHT TOTAL KNEE ARTHROPLASTY;  Surgeon: Gaynelle Arabian, MD;  Location: [facility];  Service: Orthopedics;  Laterality: Right;     History reviewed. No pertinent family history.   Social History   Socioeconomic History   Marital status: Married    Spouse name: Not on file   Number of children: 2   Years of education: Not on file   Highest education level: Not on file  Occupational History   Not on file  Tobacco Use   Smoking status: Former    Packs/day: 0.50  Years: 20.00    Additional pack years: 0.00    Total pack years: 10.00    Types: Cigarettes, Pipe, Cigars    Quit date: 10/26/1983    Years since quitting: 38.8   Smokeless tobacco: Never  Vaping Use   Vaping Use: Never used  Substance and Sexual Activity   Alcohol use: Not Currently    Comment:   RARE ALCOHOL   Drug use: No   Sexual activity: Not on file  Other Topics Concern   Not on file  Social History Narrative   Not on file   Social Determinants of Health   Financial Resource Strain: Not on file  Food Insecurity: Not on file  Transportation Needs: Not on file  Physical Activity: Not on file  Stress: Not on file  Social Connections: Not on file  Intimate Partner Violence: Not on file     BP 132/62   Pulse 80    Ht 6\' 2"  (1.88 m)   Wt 236 lb 6.4 oz (107.2 kg)   SpO2 98%   BMI 30.35 kg/m   Physical Exam:  Well appearing NAD HEENT: Unremarkable Neck:  No JVD, no thyromegally Lymphatics:  No adenopathy Back:  No CVA tenderness Lungs:  Clear with no wheezes HEART:  Regular rate rhythm, no murmurs, no rubs, no clicks Abd:  soft, positive bowel sounds, no organomegally, no rebound, no guarding Ext:  2 plus pulses, no edema, no cyanosis, no clubbing Skin:  No rashes no nodules Neuro:  CN II through XII intact, motor grossly intact  EKG - nsr with ventricular pacing  DEVICE  Normal device function.  See PaceArt for details.   Assess/Plan: CHB - his [employer] DDD PM is working normally. We will recheck in several months. Peripheral edema - we discussed keeping his legs elevated.   Carleene Overlie Gabby Rackers,MD

## 2022-09-10 DIAGNOSIS — H938X3 Other specified disorders of ear, bilateral: Secondary | ICD-10-CM | POA: Diagnosis not present

## 2022-09-16 DIAGNOSIS — M79671 Pain in right foot: Secondary | ICD-10-CM | POA: Diagnosis not present

## 2022-09-19 ENCOUNTER — Ambulatory Visit
Admission: RE | Admit: 2022-09-19 | Discharge: 2022-09-19 | Disposition: A | Discharge status: Home / Self Care | Payer: PPO | Source: Ambulatory Visit | Attending: Orthopaedic Surgery | Admitting: Orthopaedic Surgery

## 2022-09-19 ENCOUNTER — Other Ambulatory Visit: Payer: Self-pay | Admitting: Orthopaedic Surgery

## 2022-09-19 DIAGNOSIS — M79671 Pain in right foot: Secondary | ICD-10-CM | POA: Diagnosis not present

## 2022-09-19 DIAGNOSIS — M7989 Other specified soft tissue disorders: Secondary | ICD-10-CM | POA: Diagnosis not present

## 2022-09-19 DIAGNOSIS — M7741 Metatarsalgia, right foot: Secondary | ICD-10-CM | POA: Diagnosis not present

## 2022-09-19 DIAGNOSIS — R600 Localized edema: Secondary | ICD-10-CM | POA: Diagnosis not present

## 2022-10-11 DIAGNOSIS — S92354A Nondisplaced fracture of fifth metatarsal bone, right foot, initial encounter for closed fracture: Secondary | ICD-10-CM | POA: Diagnosis not present

## 2022-10-29 DIAGNOSIS — E559 Vitamin D deficiency, unspecified: Secondary | ICD-10-CM | POA: Diagnosis not present

## 2022-10-29 DIAGNOSIS — Z87891 Personal history of nicotine dependence: Secondary | ICD-10-CM | POA: Diagnosis not present

## 2022-10-29 DIAGNOSIS — Z7982 Long term (current) use of aspirin: Secondary | ICD-10-CM | POA: Diagnosis not present

## 2022-10-29 DIAGNOSIS — I442 Atrioventricular block, complete: Secondary | ICD-10-CM | POA: Diagnosis not present

## 2022-10-29 DIAGNOSIS — E785 Hyperlipidemia, unspecified: Secondary | ICD-10-CM | POA: Diagnosis not present

## 2022-10-29 DIAGNOSIS — N400 Enlarged prostate without lower urinary tract symptoms: Secondary | ICD-10-CM | POA: Diagnosis not present

## 2022-10-29 DIAGNOSIS — I10 Essential (primary) hypertension: Secondary | ICD-10-CM | POA: Diagnosis not present

## 2022-10-29 DIAGNOSIS — L409 Psoriasis, unspecified: Secondary | ICD-10-CM | POA: Diagnosis not present

## 2022-10-29 DIAGNOSIS — Z950 Presence of cardiac pacemaker: Secondary | ICD-10-CM | POA: Diagnosis not present

## 2022-10-29 DIAGNOSIS — I2510 Atherosclerotic heart disease of native coronary artery without angina pectoris: Secondary | ICD-10-CM | POA: Diagnosis not present

## 2022-10-29 DIAGNOSIS — M1990 Unspecified osteoarthritis, unspecified site: Secondary | ICD-10-CM | POA: Diagnosis not present

## 2022-10-29 DIAGNOSIS — E669 Obesity, unspecified: Secondary | ICD-10-CM | POA: Diagnosis not present

## 2022-11-07 DIAGNOSIS — Z950 Presence of cardiac pacemaker: Secondary | ICD-10-CM | POA: Diagnosis not present

## 2022-11-07 DIAGNOSIS — I442 Atrioventricular block, complete: Secondary | ICD-10-CM | POA: Diagnosis not present

## 2023-01-09 DIAGNOSIS — N401 Enlarged prostate with lower urinary tract symptoms: Secondary | ICD-10-CM | POA: Diagnosis not present

## 2023-01-09 DIAGNOSIS — R3915 Urgency of urination: Secondary | ICD-10-CM | POA: Diagnosis not present

## 2023-01-13 DIAGNOSIS — H903 Sensorineural hearing loss, bilateral: Secondary | ICD-10-CM | POA: Diagnosis not present

## 2023-01-13 DIAGNOSIS — H938X3 Other specified disorders of ear, bilateral: Secondary | ICD-10-CM | POA: Diagnosis not present

## 2023-02-06 ENCOUNTER — Ambulatory Visit: Payer: PPO | Admitting: Cardiology

## 2023-02-06 ENCOUNTER — Encounter: Payer: Self-pay | Admitting: Cardiology

## 2023-02-06 VITALS — BP 133/60 | HR 74 | Resp 16 | Ht 74.0 in | Wt 227.0 lb

## 2023-02-06 DIAGNOSIS — I442 Atrioventricular block, complete: Secondary | ICD-10-CM

## 2023-02-06 DIAGNOSIS — Z45018 Encounter for adjustment and management of other part of cardiac pacemaker: Secondary | ICD-10-CM | POA: Diagnosis not present

## 2023-02-06 DIAGNOSIS — Z950 Presence of cardiac pacemaker: Secondary | ICD-10-CM | POA: Diagnosis not present

## 2023-02-06 NOTE — Progress Notes (Signed)
Primary Physician/Referring:  Garlan Fillers, MD  Patient ID: DREDAN TIMBERS, male    DOB: 04/07/1928, 89 y.o.   MRN: [id number]  Chief Complaint  Patient presents with   Heart block AV complete [facility])   HPI:    KYMERE KLIEWER  is a 89 y.o.with very mild to moderate coronary artery disease by angiography in 2012 at revealed proximal LAD 50% stenosis and mild noncritical disease in other vessels with normal LVEF, hyperlipidemia, chronic palpitations, bilateral leg edema. He was seen by me on 05/07/2022 with complete heart block and syncope and maintaining meds for hospital admissions and he underwent permanent pacemaker implantation on 05/08/2022 by implantation of Abbott dual-chamber pacemaker.  No recurrent syncope, he is feeling the best he has in quite a while.  He is essentially asymptomatic.  He is accompanied by his daughter.  Past Medical History:  Diagnosis Date   Arthritis    STATES HIS SHOULDERS ARE "FROZEN" - VERY LIMITIED RANGE OF MOTION; OA BOTH KNEES   Atherosclerosis of native coronary artery of native heart without angina pectoris 12/29/2018   Bilateral edema of lower extremity 12/29/2018   Complete heart block (HCC) 05/07/2022   Coronary artery disease    "MILD NONCRITICAL CORONARY ARTERY DISEASE " - PER OFFICE NOTE DR. Jacinto Halim 02/27/12 - PT NO LONGER HAS TO SEE CARDIOLGIST UNLESS HE HAS A NEED.   Encounter for care of pacemaker 05/17/2022   Hx: UTI (urinary tract infection)    Hyperlipidemia    Pacemaker Dual Chamber Abbott PACEMAKER ASSURITY DR 05/08/2022 05/17/2022   Palpitations 12/29/2018   Psoriasis    Past Surgical History:  Procedure Laterality Date   CARDIAC CATHETERIZATION  02-05-11   PACEMAKER IMPLANT N/A 05/08/2022   Procedure: PACEMAKER IMPLANT;  Surgeon: Marinus Maw, MD;  Location: [facility];  Service: Cardiovascular;  Laterality: N/A;   TONSILLECTOMY     AGE 78   TOTAL KNEE ARTHROPLASTY Left 10/04/2013   Procedure: LEFT TOTAL KNEE  ARTHROPLASTY;  Surgeon: Loanne Drilling, MD;  Location: [facility];  Service: Orthopedics;  Laterality: Left;   TOTAL KNEE ARTHROPLASTY Right 10/31/2014   Procedure: RIGHT TOTAL KNEE ARTHROPLASTY;  Surgeon: Ollen Gross, MD;  Location: [facility];  Service: Orthopedics;  Laterality: Right;   History reviewed. No pertinent family history.  Social History   Tobacco Use   Smoking status: Former    Current packs/day: 0.00    Average packs/day: 0.5 packs/day for 20.0 years (10.0 ttl pk-yrs)    Types: Cigarettes, Pipe, Cigars    Start date: 10/26/1963    Quit date: 10/26/1983    Years since quitting: 39.3   Smokeless tobacco: Never  Substance Use Topics   Alcohol use: Not Currently    Comment:   RARE ALCOHOL   Marital Status: Married  ROS  Review of Systems  Cardiovascular:  Positive for leg swelling (stable). Negative for chest pain and dyspnea on exertion.   Objective      02/06/2023   11:29 AM 08/23/2022    3:36 PM 08/06/2022    3:08 PM  Vitals with BMI  Height 6\' 2"  6\' 2"  6\' 2"   Weight 227 lbs 236 lbs 6 oz 239 lbs  BMI 29.13 30.34 30.67  Systolic 133 132 469  Diastolic 60 62 61  Pulse 74 80 90   Today's Vitals   02/06/23 1129  BP: 133/60  Pulse: 74  Resp: 16  SpO2: 94%  Weight: 227 lb (103 kg)  Height: 6'  2" (1.88 m)   Body mass index is 29.15 kg/m.  Orthostatic VS for the past 72 hrs (Last 3 readings):  Patient Position BP Location Cuff Size  02/06/23 1129 Sitting Left Arm Normal     Physical Exam Constitutional:      Appearance: He is obese.  Neck:     Vascular: No carotid bruit or JVD.  Cardiovascular:     Rate and Rhythm: Regular rhythm.     Pulses: Intact distal pulses.     Heart sounds: Heart sounds are distant. No murmur heard.    No gallop.  Pulmonary:     Effort: Pulmonary effort is normal.     Breath sounds: Normal breath sounds.  Abdominal:     General: Bowel sounds are normal.     Palpations: Abdomen is soft.  Musculoskeletal:     Right lower leg:  No edema.     Left lower leg: Edema (Trace+ pitting below knee edema) present.     Medications and allergies   Allergies  Allergen Reactions   Oxycodone Other (See Comments)    Couldn't talk. Thought he'd had a stroke     Medication list after today's encounter   Current Outpatient Medications:    acetaminophen (TYLENOL) 325 MG tablet, Take 1-2 tablets (325-650 mg total) by mouth every 4 (four) hours as needed for mild pain., Disp: , Rfl:    aspirin 81 MG chewable tablet, Chew by mouth daily., Disp: , Rfl:    cholecalciferol (VITAMIN D3) 25 MCG (1000 UT) tablet, Take 1,000 Units by mouth daily., Disp: , Rfl:    Coenzyme Q10 (COQ10) 100 MG CAPS, Take 1 capsule by mouth daily., Disp: , Rfl:    metoprolol succinate (TOPROL-XL) 25 MG 24 hr tablet, Take 0.5 tablets (12.5 mg total) by mouth daily., Disp: 45 tablet, Rfl: 3   Omega-3 Fatty Acids (FISH OIL PO), Take 1 capsule by mouth at bedtime., Disp: , Rfl:    simvastatin (ZOCOR) 80 MG tablet, Take 40 mg by mouth every evening., Disp: , Rfl:    tamsulosin (FLOMAX) 0.4 MG CAPS capsule, Take 0.4 mg by mouth., Disp: , Rfl:   Laboratory examination:   Lab Results  Component Value Date   NA 136 05/15/2022   K 3.9 05/15/2022   CO2 22 05/15/2022   GLUCOSE 101 (H) 05/15/2022   BUN 10 05/15/2022   CREATININE 0.93 05/15/2022   CALCIUM 8.5 (L) 05/15/2022   GFRNONAA >60 05/15/2022       Latest Ref Rng & Units 05/15/2022    2:50 PM 05/07/2022    3:48 PM 11/03/2021    7:30 PM  BMP  Glucose 70 - 99 mg/dL 093  235  573   BUN 8 - 23 mg/dL 10  30  12    Creatinine 0.61 - 1.24 mg/dL 2.20  2.54  2.70   Sodium 135 - 145 mmol/L 136  138  137   Potassium 3.5 - 5.1 mmol/L 3.9  4.8  3.7   Chloride 98 - 111 mmol/L 106  108  105   CO2 22 - 32 mmol/L 22  20  21    Calcium 8.9 - 10.3 mg/dL 8.5  8.6  9.2       Latest Ref Rng & Units 05/15/2022    2:50 PM 05/07/2022    3:48 PM 10/26/2014   10:25 AM  Hepatic Function  Total Protein 6.5 - 8.1 g/dL  6.7  6.5  7.7   Albumin 3.5 - 5.0 g/dL 3.2  3.2  4.2   AST 15 - 41 U/L 28  30  24    ALT 0 - 44 U/L 19  14  25    Alk Phosphatase 38 - 126 U/L 66  68  61   Total Bilirubin 0.3 - 1.2 mg/dL 0.9  1.5  0.8     External labs:   Labs 12/16/2019:  Hb 13.3/HCT 39.7, platelets 156,  BUN 17, creatinine 0.87, EGFR 77 mL, potassium 4.6, LFTs normal.  Total cholesterol 132, triglycerides 63, HDL 51, LDL 68.  Radiology:    Cardiac Studies:   Exercise myoview stress 04/28/2015: 1. The resting electrocardiogram demonstrated normal sinus rhythm, normal resting conduction and no resting arrhythmias. The stress electrocardiogram was mildly positive. There was 1 mm ST depression in the inferior leads at peak exercsie which normalized at 2 minutes into recovery. occasional PVC. The patient performed treadmill exercise using a Bruce protocol, completing 3:36 minutes, 116% of the maximum predicted heart rate. Hypertensive BP, peak BP198/100 mmHg. Stress symptoms included dyspnea. Reduced aerobic tolerence. 2. Perfusion imaging study demonstrates mild apical thinning without ischemia or scar. Left ventricle systolic function Calculated by QGS was normal at 52% without regional wall motion abnormality. This represents a low risk study.  Echocardiogram 03/09/2015: Left ventricle cavity is normal in size. Mild decrease in LV systolic function. Normal diastolic filling pattern. EF estimated at 45%. Right ventricle cavity is borderline dilated. Mildly reduced right ventricular function. IVC is dilated with respiratory variation. This suggests elevated right heart pressure.  Heart cath 02/05/11 Proximal LAD 20-30% calcific and mid RCA 20% stenosis. Normal LVEF. Non crital CAD. 2007, 30-40% stenosis in the proximal to midsegment of the LAD.   Pacemaker Dual Chamber Abbott PACEMAKER ASSURITY DR 05/08/2022    Remote dual-chamber pacemaker transmission 11/07/2022: Longevity 7 years and 7 months. AP 15 %, VP >99%. Lead  impedance and thresholds are normal. Multiple brief AT/AF episodes, longest 23 minutes. AT/AF burden <1%. Normal pacemaker function.   EKG:   EKG 02/06/2023: Atrially sensed with ventricularly paced rhythm with first-degree AV block.  Ventricular rate 72 bpm.  Compared to 08/06/2022, no significant change.  Assessment     ICD-10-CM   1. Complete heart block (HCC)  I44.2 EKG 12-Lead    2. Pacemaker Dual Chamber Abbott PACEMAKER ASSURITY DR 05/08/2022  Z95.0       Orders Placed This Encounter  Procedures   EKG 12-Lead   No orders of the defined types were placed in this encounter.  Medications Discontinued During This Encounter  Medication Reason   Adalimumab (HUMIRA PEDIATRIC CROHNS START) 40 MG/0.8ML PSKT       Recommendations:   ELNATHAN MION is a 89 y.o.  with very mild to moderate coronary artery disease by angiography in 2012 at revealed proximal LAD 50% stenosis and mild noncritical disease in other vessels with normal LVEF, hyperlipidemia, chronic bilateral leg edema. He was seen by me on 05/07/2022 with complete heart block and syncope and maintaining meds for hospital admissions and he underwent permanent pacemaker implantation on 05/08/2022 by implantation of Abbott dual-chamber pacemaker.  1. Heart block AV complete (HCC) No recurrent syncope, he is feeling the best he has in quite a while.Marland Kitchen He is  pacing 100% of the time in the V.  He has had brief episodes of atrial fibrillation, but no sustained episodes hence no indication for anticoagulation.  AT/AF burden <1%.  2. Pacemaker Dual Chamber Abbott PACEMAKER ASSURITY DR 05/08/2022 Dual-chamber pacemaker is functioning normally.  He  will continue to transmit remotely.  I will request Dr. Sharrell Ku to take over his care as patient is only cardiac issue is complete heart block and need of pacemaker.  I will see him back on a as needed basis.    Yates Decamp, MD, [facility] 02/06/2023, 11:57 AM Office: [phone]

## 2023-02-13 ENCOUNTER — Other Ambulatory Visit: Payer: Self-pay | Admitting: Cardiology

## 2023-02-13 DIAGNOSIS — I2510 Atherosclerotic heart disease of native coronary artery without angina pectoris: Secondary | ICD-10-CM | POA: Diagnosis not present

## 2023-02-13 DIAGNOSIS — Z125 Encounter for screening for malignant neoplasm of prostate: Secondary | ICD-10-CM | POA: Diagnosis not present

## 2023-02-13 DIAGNOSIS — E7800 Pure hypercholesterolemia, unspecified: Secondary | ICD-10-CM | POA: Diagnosis not present

## 2023-02-13 DIAGNOSIS — I10 Essential (primary) hypertension: Secondary | ICD-10-CM | POA: Diagnosis not present

## 2023-02-13 DIAGNOSIS — R002 Palpitations: Secondary | ICD-10-CM

## 2023-02-13 DIAGNOSIS — E875 Hyperkalemia: Secondary | ICD-10-CM | POA: Diagnosis not present

## 2023-02-20 DIAGNOSIS — I10 Essential (primary) hypertension: Secondary | ICD-10-CM | POA: Diagnosis not present

## 2023-02-20 DIAGNOSIS — R82998 Other abnormal findings in urine: Secondary | ICD-10-CM | POA: Diagnosis not present

## 2023-02-20 DIAGNOSIS — Z950 Presence of cardiac pacemaker: Secondary | ICD-10-CM | POA: Diagnosis not present

## 2023-02-20 DIAGNOSIS — Z1339 Encounter for screening examination for other mental health and behavioral disorders: Secondary | ICD-10-CM | POA: Diagnosis not present

## 2023-02-20 DIAGNOSIS — Z1331 Encounter for screening for depression: Secondary | ICD-10-CM | POA: Diagnosis not present

## 2023-02-20 DIAGNOSIS — Z0000 Encounter for general adult medical examination without abnormal findings: Secondary | ICD-10-CM | POA: Diagnosis not present

## 2023-02-20 DIAGNOSIS — Z23 Encounter for immunization: Secondary | ICD-10-CM | POA: Diagnosis not present

## 2023-02-20 DIAGNOSIS — H903 Sensorineural hearing loss, bilateral: Secondary | ICD-10-CM | POA: Diagnosis not present

## 2023-02-20 DIAGNOSIS — E7800 Pure hypercholesterolemia, unspecified: Secondary | ICD-10-CM | POA: Diagnosis not present

## 2023-02-20 DIAGNOSIS — R2681 Unsteadiness on feet: Secondary | ICD-10-CM | POA: Diagnosis not present

## 2023-02-20 DIAGNOSIS — I2510 Atherosclerotic heart disease of native coronary artery without angina pectoris: Secondary | ICD-10-CM | POA: Diagnosis not present

## 2023-05-08 ENCOUNTER — Ambulatory Visit: Payer: PPO

## 2023-05-08 DIAGNOSIS — I442 Atrioventricular block, complete: Secondary | ICD-10-CM | POA: Diagnosis not present

## 2023-05-08 LAB — CUP PACEART REMOTE DEVICE CHECK
Battery Remaining Longevity: 86 mo
Battery Remaining Percentage: 89 %
Battery Voltage: 2.99 V
Brady Statistic AP VP Percent: 34 %
Brady Statistic AP VS Percent: 1 %
Brady Statistic AS VP Percent: 66 %
Brady Statistic AS VS Percent: 1 %
Brady Statistic RA Percent Paced: 33 %
Brady Statistic RV Percent Paced: 99 %
Date Time Interrogation Session: 20241212020235
Implantable Lead Connection Status: 753985
Implantable Lead Connection Status: 753985
Implantable Lead Implant Date: 20231213
Implantable Lead Implant Date: 20231213
Implantable Lead Location: 753859
Implantable Lead Location: 753860
Implantable Pulse Generator Implant Date: 20231213
Lead Channel Impedance Value: 380 Ohm
Lead Channel Impedance Value: 380 Ohm
Lead Channel Pacing Threshold Amplitude: 0.75 V
Lead Channel Pacing Threshold Amplitude: 0.75 V
Lead Channel Pacing Threshold Pulse Width: 0.5 ms
Lead Channel Pacing Threshold Pulse Width: 0.5 ms
Lead Channel Sensing Intrinsic Amplitude: 1.5 mV
Lead Channel Sensing Intrinsic Amplitude: 6.6 mV
Lead Channel Setting Pacing Amplitude: 2 V
Lead Channel Setting Pacing Amplitude: 2.5 V
Lead Channel Setting Pacing Pulse Width: 0.5 ms
Lead Channel Setting Sensing Sensitivity: 5 mV
Pulse Gen Model: 2272
Pulse Gen Serial Number: 8129288

## 2023-05-15 DIAGNOSIS — H6123 Impacted cerumen, bilateral: Secondary | ICD-10-CM | POA: Diagnosis not present

## 2023-05-15 DIAGNOSIS — Z974 Presence of external hearing-aid: Secondary | ICD-10-CM | POA: Diagnosis not present

## 2023-06-10 IMAGING — DX DG CHEST 1V PORT
1 series · 1 of 1 positions shown · non-contrast
Comparison: 09/23/2013

CLINICAL DATA: Fever, cough

EXAM:
PORTABLE CHEST 1 VIEW

[chest ap]
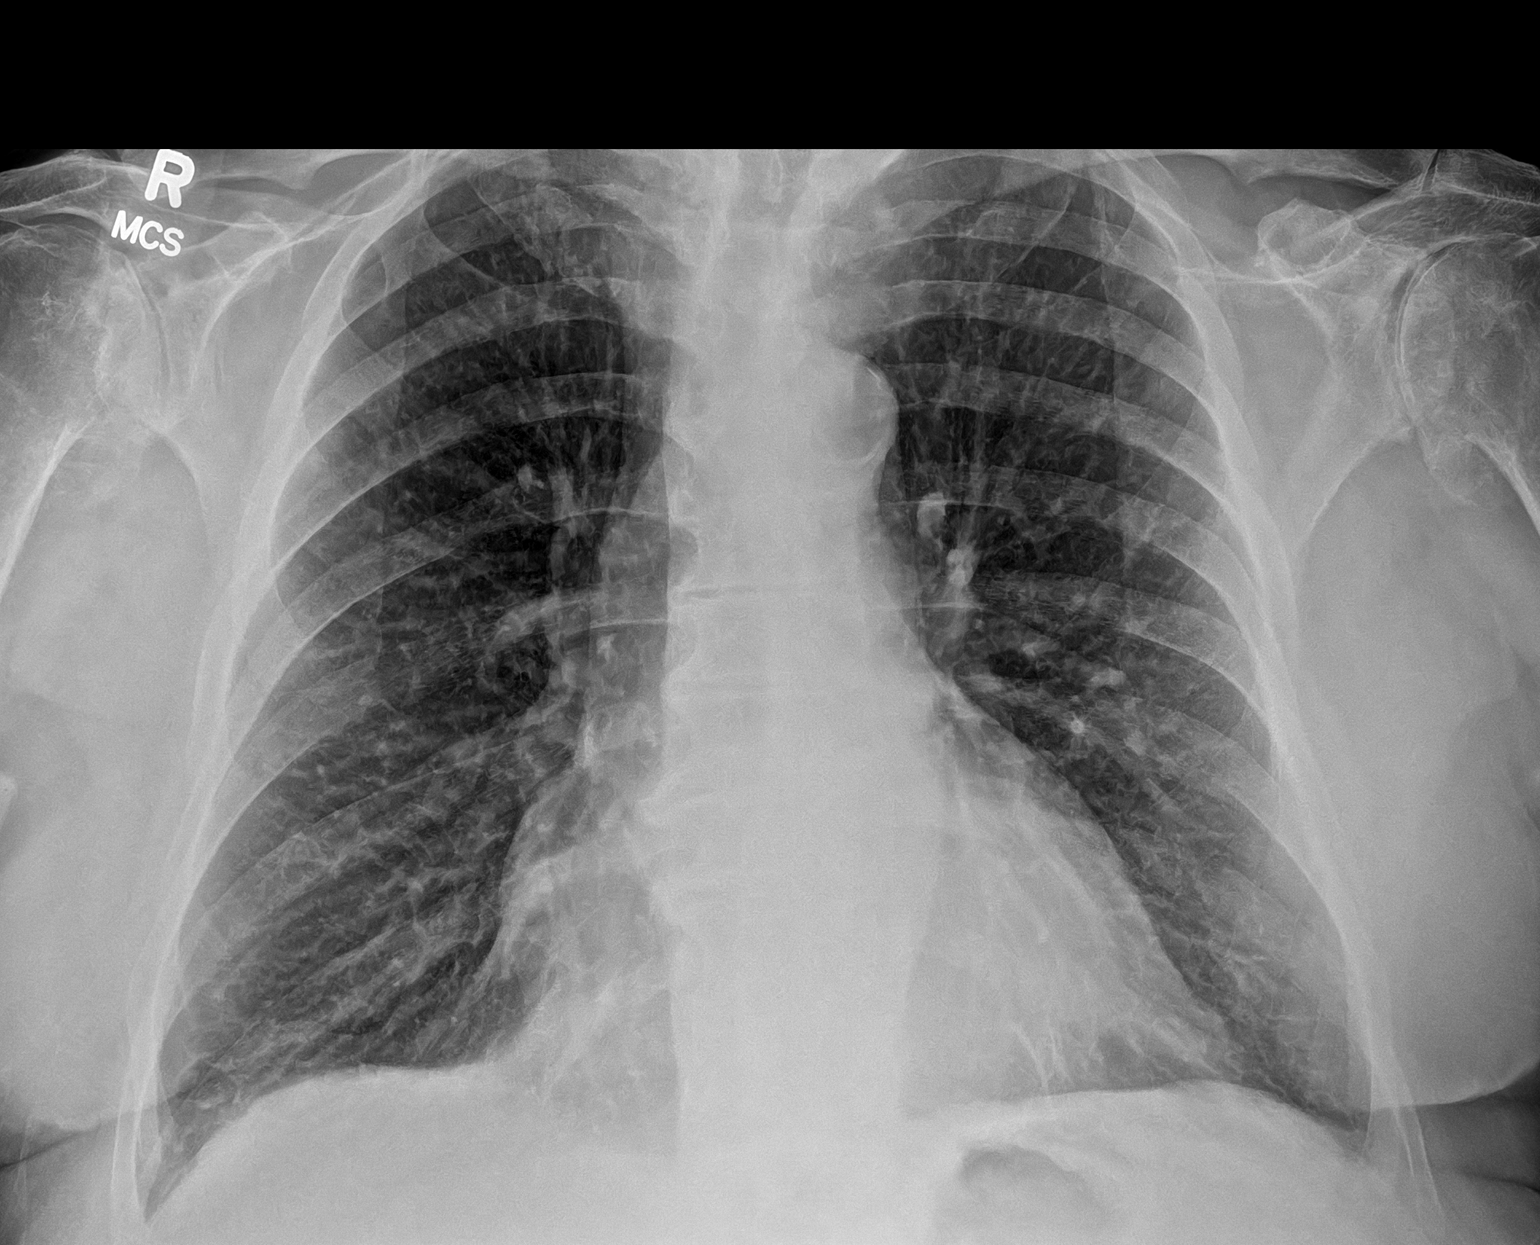

[1 of 1 positions shown; findings below may reference images not displayed]

FINDINGS: Scarring in the lung bases. Heart is normal size. Aortic
atherosclerosis. Mediastinal contours are within normal limits. No
confluent opacities or effusions. No acute bony abnormality.
Degenerative changes in the shoulders.
IMPRESSION: Chronic changes.  No active disease.

## 2023-06-12 NOTE — Progress Notes (Signed)
Remote pacemaker transmission.   

## 2023-06-19 ENCOUNTER — Emergency Department (HOSPITAL_COMMUNITY)
Admission: EM | Admit: 2023-06-19 | Discharge: 2023-06-19 | Disposition: A | Discharge status: Home / Self Care | Payer: PPO | Attending: Emergency Medicine | Admitting: Emergency Medicine

## 2023-06-19 ENCOUNTER — Encounter (HOSPITAL_COMMUNITY): Payer: Self-pay

## 2023-06-19 ENCOUNTER — Other Ambulatory Visit: Payer: Self-pay

## 2023-06-19 DIAGNOSIS — Z5321 Procedure and treatment not carried out due to patient leaving prior to being seen by health care provider: Secondary | ICD-10-CM | POA: Insufficient documentation

## 2023-06-19 DIAGNOSIS — M71041 Abscess of bursa, right hand: Secondary | ICD-10-CM | POA: Insufficient documentation

## 2023-06-19 NOTE — ED Triage Notes (Signed)
Pt reports nodule to right hand and started abx on 1/6 for the area due to redness and swelling.  Pt reports no relief with abx. Patient denies pain.  Patient reports missing doses of medication and still taking abx.

## 2023-07-21 DIAGNOSIS — C44622 Squamous cell carcinoma of skin of right upper limb, including shoulder: Secondary | ICD-10-CM | POA: Diagnosis not present

## 2023-07-21 DIAGNOSIS — D485 Neoplasm of uncertain behavior of skin: Secondary | ICD-10-CM | POA: Diagnosis not present

## 2023-08-07 ENCOUNTER — Ambulatory Visit

## 2023-08-07 DIAGNOSIS — I442 Atrioventricular block, complete: Secondary | ICD-10-CM

## 2023-08-07 LAB — CUP PACEART REMOTE DEVICE CHECK
Battery Remaining Longevity: 83 mo
Battery Remaining Percentage: 87 %
Battery Voltage: 2.99 V
Brady Statistic AP VP Percent: 36 %
Brady Statistic AP VS Percent: 1 %
Brady Statistic AS VP Percent: 64 %
Brady Statistic AS VS Percent: 1 %
Brady Statistic RA Percent Paced: 35 %
Brady Statistic RV Percent Paced: 99 %
Date Time Interrogation Session: 20250313033912
Implantable Lead Connection Status: 753985
Implantable Lead Connection Status: 753985
Implantable Lead Implant Date: 20231213
Implantable Lead Implant Date: 20231213
Implantable Lead Location: 753859
Implantable Lead Location: 753860
Implantable Pulse Generator Implant Date: 20231213
Lead Channel Impedance Value: 360 Ohm
Lead Channel Impedance Value: 380 Ohm
Lead Channel Pacing Threshold Amplitude: 0.75 V
Lead Channel Pacing Threshold Amplitude: 0.75 V
Lead Channel Pacing Threshold Pulse Width: 0.5 ms
Lead Channel Pacing Threshold Pulse Width: 0.5 ms
Lead Channel Sensing Intrinsic Amplitude: 1.7 mV
Lead Channel Sensing Intrinsic Amplitude: 6.6 mV
Lead Channel Setting Pacing Amplitude: 2 V
Lead Channel Setting Pacing Amplitude: 2.5 V
Lead Channel Setting Pacing Pulse Width: 0.5 ms
Lead Channel Setting Sensing Sensitivity: 5 mV
Pulse Gen Model: 2272
Pulse Gen Serial Number: 8129288

## 2023-08-11 ENCOUNTER — Encounter: Payer: Self-pay | Admitting: Internal Medicine

## 2023-08-19 DIAGNOSIS — Z974 Presence of external hearing-aid: Secondary | ICD-10-CM | POA: Diagnosis not present

## 2023-08-19 DIAGNOSIS — H6121 Impacted cerumen, right ear: Secondary | ICD-10-CM | POA: Diagnosis not present

## 2023-08-29 DIAGNOSIS — C44622 Squamous cell carcinoma of skin of right upper limb, including shoulder: Secondary | ICD-10-CM | POA: Diagnosis not present

## 2023-09-19 NOTE — Progress Notes (Signed)
 Remote pacemaker transmission.

## 2023-10-23 DIAGNOSIS — L821 Other seborrheic keratosis: Secondary | ICD-10-CM | POA: Diagnosis not present

## 2023-10-23 DIAGNOSIS — L570 Actinic keratosis: Secondary | ICD-10-CM | POA: Diagnosis not present

## 2023-10-23 DIAGNOSIS — L814 Other melanin hyperpigmentation: Secondary | ICD-10-CM | POA: Diagnosis not present

## 2023-10-23 DIAGNOSIS — D225 Melanocytic nevi of trunk: Secondary | ICD-10-CM | POA: Diagnosis not present

## 2023-10-23 DIAGNOSIS — L400 Psoriasis vulgaris: Secondary | ICD-10-CM | POA: Diagnosis not present

## 2023-10-23 DIAGNOSIS — Z08 Encounter for follow-up examination after completed treatment for malignant neoplasm: Secondary | ICD-10-CM | POA: Diagnosis not present

## 2023-10-23 DIAGNOSIS — Z85828 Personal history of other malignant neoplasm of skin: Secondary | ICD-10-CM | POA: Diagnosis not present

## 2023-11-06 ENCOUNTER — Ambulatory Visit (INDEPENDENT_AMBULATORY_CARE_PROVIDER_SITE_OTHER)

## 2023-11-06 DIAGNOSIS — I442 Atrioventricular block, complete: Secondary | ICD-10-CM | POA: Diagnosis not present

## 2023-11-06 LAB — CUP PACEART REMOTE DEVICE CHECK
Battery Remaining Longevity: 82 mo
Battery Remaining Percentage: 84 %
Battery Voltage: 2.99 V
Brady Statistic AP VP Percent: 35 %
Brady Statistic AP VS Percent: 1 %
Brady Statistic AS VP Percent: 65 %
Brady Statistic AS VS Percent: 1 %
Brady Statistic RA Percent Paced: 34 %
Brady Statistic RV Percent Paced: 99 %
Date Time Interrogation Session: 20250612020013
Implantable Lead Connection Status: 753985
Implantable Lead Connection Status: 753985
Implantable Lead Implant Date: 20231213
Implantable Lead Implant Date: 20231213
Implantable Lead Location: 753859
Implantable Lead Location: 753860
Implantable Pulse Generator Implant Date: 20231213
Lead Channel Impedance Value: 380 Ohm
Lead Channel Impedance Value: 390 Ohm
Lead Channel Pacing Threshold Amplitude: 0.75 V
Lead Channel Pacing Threshold Amplitude: 0.75 V
Lead Channel Pacing Threshold Pulse Width: 0.5 ms
Lead Channel Pacing Threshold Pulse Width: 0.5 ms
Lead Channel Sensing Intrinsic Amplitude: 1.4 mV
Lead Channel Sensing Intrinsic Amplitude: 6.6 mV
Lead Channel Setting Pacing Amplitude: 2 V
Lead Channel Setting Pacing Amplitude: 2.5 V
Lead Channel Setting Pacing Pulse Width: 0.5 ms
Lead Channel Setting Sensing Sensitivity: 5 mV
Pulse Gen Model: 2272
Pulse Gen Serial Number: 8129288

## 2023-11-09 ENCOUNTER — Ambulatory Visit: Payer: Self-pay | Admitting: Internal Medicine

## 2023-12-01 DIAGNOSIS — Z974 Presence of external hearing-aid: Secondary | ICD-10-CM | POA: Diagnosis not present

## 2023-12-01 DIAGNOSIS — H6123 Impacted cerumen, bilateral: Secondary | ICD-10-CM | POA: Diagnosis not present

## 2023-12-03 DIAGNOSIS — H26491 Other secondary cataract, right eye: Secondary | ICD-10-CM | POA: Diagnosis not present

## 2023-12-03 DIAGNOSIS — H35363 Drusen (degenerative) of macula, bilateral: Secondary | ICD-10-CM | POA: Diagnosis not present

## 2023-12-03 DIAGNOSIS — H40013 Open angle with borderline findings, low risk, bilateral: Secondary | ICD-10-CM | POA: Diagnosis not present

## 2023-12-03 DIAGNOSIS — H31091 Other chorioretinal scars, right eye: Secondary | ICD-10-CM | POA: Diagnosis not present

## 2023-12-03 DIAGNOSIS — H524 Presbyopia: Secondary | ICD-10-CM | POA: Diagnosis not present

## 2023-12-31 NOTE — Progress Notes (Signed)
 Remote pacemaker transmission.

## 2024-02-05 ENCOUNTER — Ambulatory Visit (INDEPENDENT_AMBULATORY_CARE_PROVIDER_SITE_OTHER)

## 2024-02-05 DIAGNOSIS — I442 Atrioventricular block, complete: Secondary | ICD-10-CM

## 2024-02-05 LAB — CUP PACEART REMOTE DEVICE CHECK
Battery Remaining Longevity: 79 mo
Battery Remaining Percentage: 81 %
Battery Voltage: 2.99 V
Brady Statistic AP VP Percent: 35 %
Brady Statistic AP VS Percent: 1 %
Brady Statistic AS VP Percent: 65 %
Brady Statistic AS VS Percent: 1 %
Brady Statistic RA Percent Paced: 34 %
Brady Statistic RV Percent Paced: 99 %
Date Time Interrogation Session: 20250911020014
Implantable Lead Connection Status: 753985
Implantable Lead Connection Status: 753985
Implantable Lead Implant Date: 20231213
Implantable Lead Implant Date: 20231213
Implantable Lead Location: 753859
Implantable Lead Location: 753860
Implantable Pulse Generator Implant Date: 20231213
Lead Channel Impedance Value: 390 Ohm
Lead Channel Impedance Value: 410 Ohm
Lead Channel Pacing Threshold Amplitude: 0.75 V
Lead Channel Pacing Threshold Amplitude: 0.75 V
Lead Channel Pacing Threshold Pulse Width: 0.5 ms
Lead Channel Pacing Threshold Pulse Width: 0.5 ms
Lead Channel Sensing Intrinsic Amplitude: 2.1 mV
Lead Channel Sensing Intrinsic Amplitude: 6.6 mV
Lead Channel Setting Pacing Amplitude: 2 V
Lead Channel Setting Pacing Amplitude: 2.5 V
Lead Channel Setting Pacing Pulse Width: 0.5 ms
Lead Channel Setting Sensing Sensitivity: 5 mV
Pulse Gen Model: 2272
Pulse Gen Serial Number: 8129288

## 2024-02-09 DIAGNOSIS — Z974 Presence of external hearing-aid: Secondary | ICD-10-CM | POA: Diagnosis not present

## 2024-02-09 DIAGNOSIS — H6123 Impacted cerumen, bilateral: Secondary | ICD-10-CM | POA: Diagnosis not present

## 2024-02-13 ENCOUNTER — Ambulatory Visit: Payer: Self-pay | Admitting: Internal Medicine

## 2024-02-13 NOTE — Progress Notes (Signed)
 Remote PPM Transmission

## 2024-02-26 DIAGNOSIS — I2510 Atherosclerotic heart disease of native coronary artery without angina pectoris: Secondary | ICD-10-CM | POA: Diagnosis not present

## 2024-02-26 DIAGNOSIS — E7800 Pure hypercholesterolemia, unspecified: Secondary | ICD-10-CM | POA: Diagnosis not present

## 2024-02-26 DIAGNOSIS — Z125 Encounter for screening for malignant neoplasm of prostate: Secondary | ICD-10-CM | POA: Diagnosis not present

## 2024-03-04 DIAGNOSIS — I10 Essential (primary) hypertension: Secondary | ICD-10-CM | POA: Diagnosis not present

## 2024-03-04 DIAGNOSIS — Z23 Encounter for immunization: Secondary | ICD-10-CM | POA: Diagnosis not present

## 2024-03-25 ENCOUNTER — Other Ambulatory Visit: Payer: Self-pay | Admitting: Cardiology

## 2024-03-25 DIAGNOSIS — R002 Palpitations: Secondary | ICD-10-CM

## 2024-03-29 DIAGNOSIS — M19011 Primary osteoarthritis, right shoulder: Secondary | ICD-10-CM | POA: Diagnosis not present

## 2024-03-30 DIAGNOSIS — N401 Enlarged prostate with lower urinary tract symptoms: Secondary | ICD-10-CM | POA: Diagnosis not present

## 2024-03-30 DIAGNOSIS — R3915 Urgency of urination: Secondary | ICD-10-CM | POA: Diagnosis not present

## 2024-04-08 DIAGNOSIS — M19011 Primary osteoarthritis, right shoulder: Secondary | ICD-10-CM | POA: Diagnosis not present

## 2024-04-25 ENCOUNTER — Other Ambulatory Visit: Payer: Self-pay | Admitting: Cardiology

## 2024-04-25 DIAGNOSIS — R002 Palpitations: Secondary | ICD-10-CM

## 2024-04-29 MED ORDER — METOPROLOL SUCCINATE ER 25 MG PO TB24: 12.5000 mg | ORAL_TABLET | Freq: Every day | ORAL | 0 refills | Status: AC

## 2024-04-29 MED ORDER — METOPROLOL SUCCINATE ER 25 MG PO TB24: 12.5000 mg | ORAL_TABLET | Freq: Every day | ORAL | 0 refills | Status: DC

## 2024-04-29 NOTE — Addendum Note (Signed)
 Addended by: BLUFORD, Johnatha Zeidman L on: 04/29/2024 03:06 PM   Modules accepted: Orders

## 2024-04-29 NOTE — Addendum Note (Signed)
 Addended by: BLUFORD, Damon Hargrove L on: 04/29/2024 03:07 PM   Modules accepted: Orders

## 2024-05-04 DIAGNOSIS — L821 Other seborrheic keratosis: Secondary | ICD-10-CM | POA: Diagnosis not present

## 2024-05-04 DIAGNOSIS — L814 Other melanin hyperpigmentation: Secondary | ICD-10-CM | POA: Diagnosis not present

## 2024-05-04 DIAGNOSIS — L400 Psoriasis vulgaris: Secondary | ICD-10-CM | POA: Diagnosis not present

## 2024-05-04 DIAGNOSIS — Z85828 Personal history of other malignant neoplasm of skin: Secondary | ICD-10-CM | POA: Diagnosis not present

## 2024-05-04 DIAGNOSIS — D1801 Hemangioma of skin and subcutaneous tissue: Secondary | ICD-10-CM | POA: Diagnosis not present

## 2024-05-04 DIAGNOSIS — Z08 Encounter for follow-up examination after completed treatment for malignant neoplasm: Secondary | ICD-10-CM | POA: Diagnosis not present

## 2024-05-06 ENCOUNTER — Ambulatory Visit

## 2024-05-06 DIAGNOSIS — I442 Atrioventricular block, complete: Secondary | ICD-10-CM | POA: Diagnosis not present

## 2024-05-07 LAB — CUP PACEART REMOTE DEVICE CHECK
Battery Remaining Longevity: 76 mo
Battery Remaining Percentage: 78 %
Battery Voltage: 2.99 V
Brady Statistic AP VP Percent: 35 %
Brady Statistic AP VS Percent: 1 %
Brady Statistic AS VP Percent: 65 %
Brady Statistic AS VS Percent: 1 %
Brady Statistic RA Percent Paced: 34 %
Brady Statistic RV Percent Paced: 99 %
Date Time Interrogation Session: 20251211020022
Implantable Lead Connection Status: 753985
Implantable Lead Connection Status: 753985
Implantable Lead Implant Date: 20231213
Implantable Lead Implant Date: 20231213
Implantable Lead Location: 753859
Implantable Lead Location: 753860
Implantable Pulse Generator Implant Date: 20231213
Lead Channel Impedance Value: 380 Ohm
Lead Channel Impedance Value: 390 Ohm
Lead Channel Pacing Threshold Amplitude: 0.75 V
Lead Channel Pacing Threshold Amplitude: 0.75 V
Lead Channel Pacing Threshold Pulse Width: 0.5 ms
Lead Channel Pacing Threshold Pulse Width: 0.5 ms
Lead Channel Sensing Intrinsic Amplitude: 1.5 mV
Lead Channel Sensing Intrinsic Amplitude: 7.2 mV
Lead Channel Setting Pacing Amplitude: 2 V
Lead Channel Setting Pacing Amplitude: 2.5 V
Lead Channel Setting Pacing Pulse Width: 0.5 ms
Lead Channel Setting Sensing Sensitivity: 5 mV
Pulse Gen Model: 2272
Pulse Gen Serial Number: 8129288

## 2024-05-09 ENCOUNTER — Ambulatory Visit: Payer: Self-pay | Admitting: Internal Medicine

## 2024-05-13 NOTE — Progress Notes (Signed)
 Remote PPM Transmission

## 2024-05-14 ENCOUNTER — Other Ambulatory Visit: Payer: Self-pay | Admitting: Cardiology

## 2024-05-14 DIAGNOSIS — R002 Palpitations: Secondary | ICD-10-CM

## 2024-08-05 ENCOUNTER — Encounter
